# Patient Record
Sex: Male | Born: 1970 | Race: Black or African American | Hispanic: No | Marital: Married | Smoking: Never smoker
Health system: Southern US, Community
[De-identification: ages and names within clinical notes are randomized; demographics above are authoritative.]

## PROBLEM LIST (undated history)

## (undated) DIAGNOSIS — E7800 Pure hypercholesterolemia, unspecified: Secondary | ICD-10-CM

## (undated) DIAGNOSIS — K219 Gastro-esophageal reflux disease without esophagitis: Secondary | ICD-10-CM

## (undated) DIAGNOSIS — Z8619 Personal history of other infectious and parasitic diseases: Secondary | ICD-10-CM

## (undated) HISTORY — DX: Gastro-esophageal reflux disease without esophagitis: K21.9

## (undated) HISTORY — DX: Personal history of other infectious and parasitic diseases: Z86.19

## (undated) HISTORY — PX: ACHILLES TENDON SURGERY: SHX542

## (undated) HISTORY — PX: POLYPECTOMY: SHX149

## (undated) HISTORY — PX: ANTERIOR CRUCIATE LIGAMENT REPAIR: SHX115

## (undated) HISTORY — DX: Pure hypercholesterolemia, unspecified: E78.00

## (undated) HISTORY — PX: ANKLE SURGERY: SHX546

---

## 2008-10-18 ENCOUNTER — Emergency Department: Payer: Self-pay | Admitting: Emergency Medicine
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
## 2013-07-23 ENCOUNTER — Encounter (INDEPENDENT_AMBULATORY_CARE_PROVIDER_SITE_OTHER): Payer: Self-pay

## 2013-07-23 ENCOUNTER — Encounter: Payer: Self-pay | Admitting: Internal Medicine

## 2013-07-23 ENCOUNTER — Ambulatory Visit (INDEPENDENT_AMBULATORY_CARE_PROVIDER_SITE_OTHER): Payer: 59 | Admitting: Internal Medicine

## 2013-07-23 VITALS — BP 110/80 | HR 66 | Temp 98.5°F | Ht 71.75 in | Wt 226.2 lb

## 2013-07-23 DIAGNOSIS — E7800 Pure hypercholesterolemia, unspecified: Secondary | ICD-10-CM

## 2013-07-23 DIAGNOSIS — Z13220 Encounter for screening for lipoid disorders: Secondary | ICD-10-CM

## 2013-07-24 ENCOUNTER — Encounter: Payer: Self-pay | Admitting: Internal Medicine

## 2013-07-24 DIAGNOSIS — E7800 Pure hypercholesterolemia, unspecified: Secondary | ICD-10-CM | POA: Insufficient documentation

## 2013-07-24 NOTE — Assessment & Plan Note (Signed)
Low cholesterol diet and exercise.  Follow lipid panel.  Will recheck in the next couple of months.

## 2013-07-24 NOTE — Progress Notes (Signed)
  Subjective:    Patient ID: Dean Callahan, male    DOB: July 01, 1971, 42 y.o.   MRN: [id number]  HPI 42 year old male with past history of hypercholesterolemia who comes in today to follow up on this as well as to transfer his care here to [facility].  Former pt of mine at [employer].  States he has been doing well.  Stays active.  Has some issues with his knees.  Played sports when he was younger.  Had knee injuries and increased pounding and stress on his knees.  He is able to exercise, but is unable to run.  He can do the elliptical machine with no problems.  No chest pain or tightness with increased activity or exertion.  No sob.  Eating and drinking well.  No nausea or vomiting.  No urinary or bowel issues.  Work going well.  Recent labs with increased cholesterol.  Discussed low cholesterol diet and exercise.     Past Medical History  Diagnosis Date  . History of chicken pox   . Hypercholesterolemia     Review of Systems Patient denies any headache, lightheadedness or dizziness.  No sinus or allergy symptoms.   No chest pain, tightness or palpitations.  No increased shortness of breath, cough or congestion.  No nausea or vomiting.  No acid reflux.  No abdominal pain or cramping.  No bowel change, such as diarrhea, constipation, BRBPR or melana.  No urine change.  Overall feels he is doing well.  Some knee issues as outlined.        Objective:   Physical Exam Filed Vitals:   07/23/13 1034  BP: 110/80  Pulse: 66  Temp: 98.5 F (36.9 C)   Blood pressure recheck:  114/76, pulse 46  42 year old male in no acute distress.  HEENT:  Nares - clear.  Oropharynx - without lesions. NECK:  Supple.  Nontender.  No audible carotid bruit.  HEART:  Appears to be regular.   LUNGS:  No crackles or wheezing audible.  Respirations even and unlabored.   RADIAL PULSE:  Equal bilaterally.  ABDOMEN:  Soft.  Nontender.  Bowel sounds present and normal.  No audible abdominal bruit.   EXTREMITIES:  No increased  edema present.  DP pulses palpable and equal bilaterally.         Assessment & Plan:  HEALTH MAINTENANCE.  Schedule him for a physical next visit.  We discussed screening colonoscopy when due.  Obtain outside records.    I spent 25 minutes with the patient and more than 50% of the time was spent in consultation regarding the above.

## 2013-08-19 ENCOUNTER — Telehealth: Payer: Self-pay | Admitting: *Deleted

## 2013-08-19 NOTE — Telephone Encounter (Signed)
LMTCB: Notify pt that labs drawn at [facility] showed a slight increase in his total & LDL cholesterol. Continue low carb diet & exercise. Other labs are okay. Slight increase CR-but ok. Stay hydrated. Would like to recheck lab in the next few weeks. We will mail a lab order after he is aware of his lab results.

## 2013-09-11 ENCOUNTER — Encounter: Payer: Self-pay | Admitting: *Deleted

## 2013-10-29 ENCOUNTER — Encounter: Payer: Self-pay | Admitting: Internal Medicine

## 2013-11-10 ENCOUNTER — Ambulatory Visit (INDEPENDENT_AMBULATORY_CARE_PROVIDER_SITE_OTHER): Payer: 59 | Admitting: Internal Medicine

## 2013-11-10 ENCOUNTER — Encounter: Payer: Self-pay | Admitting: Internal Medicine

## 2013-11-10 VITALS — BP 130/80 | HR 71 | Temp 98.8°F | Ht 71.5 in | Wt 233.2 lb

## 2013-11-10 DIAGNOSIS — M25569 Pain in unspecified knee: Secondary | ICD-10-CM

## 2013-11-10 DIAGNOSIS — E7800 Pure hypercholesterolemia, unspecified: Secondary | ICD-10-CM

## 2013-11-10 NOTE — Progress Notes (Signed)
Pre-visit discussion using our clinic review tool. No additional management support is needed unless otherwise documented below in the visit note.  

## 2013-11-10 NOTE — Progress Notes (Signed)
  Subjective:    Patient ID: Dean Callahan, male    DOB: 11/25/1970, 43 y.o.   MRN: [id number]  HPI 43 year old male with past history of hypercholesterolemia who comes in today to follow up this as well as for a complete physical exam.   States he has been doing well.  Stays active.  Has some issues with his knees.  Played sports when he was younger.  Had knee injuries and increased pounding and stress on his knees.  He is able to exercise, but is unable to run.  He can do the elliptical machine with no problems.  No chest pain or tightness with increased activity or exertion.  No sob.  Eating and drinking well.  No nausea or vomiting.  No urinary or bowel issues.  Work going well.  Recent labs with increased cholesterol.  Discussed low cholesterol diet and exercise.     Past Medical History  Diagnosis Date  . History of chicken pox   . Hypercholesterolemia     Review of Systems Patient denies any headache, lightheadedness or dizziness.  No sinus or allergy symptoms.   No chest pain, tightness or palpitations.  No increased shortness of breath, cough or congestion.  No nausea or vomiting.  No acid reflux.  No abdominal pain or cramping.  No bowel change, such as diarrhea, constipation, BRBPR or melana.  No urine change.  Overall feels he is doing well.  Some knee issues as outlined.        Objective:   Physical Exam  Filed Vitals:   11/10/13 1037  BP: 130/80  Pulse: 71  Temp: 98.8 F (37.1 C)   Blood pressure recheck:  30/21  43 year old male in no acute distress.  HEENT:  Nares - clear.  Oropharynx - without lesions. NECK:  Supple.  Nontender.  No audible carotid bruit.  HEART:  Appears to be regular.   LUNGS:  No crackles or wheezing audible.  Respirations even and unlabored.   RADIAL PULSE:  Equal bilaterally.  ABDOMEN:  Soft.  Nontender.  Bowel sounds present and normal.  No audible abdominal bruit.  GU:  Normal descended testicles.  No palpable testicular nodules.   RECTAL:   Could not appreciate any palpable prostate nodules.  Heme negative.   EXTREMITIES:  No increased edema present.  DP pulses palpable and equal bilaterally.         Assessment & Plan:  HEALTH MAINTENANCE. Physical today.   We discussed screening colonoscopy when due.     I spent 25 minutes with the patient and more than 50% of the time was spent in consultation regarding the above.

## 2013-11-11 ENCOUNTER — Encounter: Payer: Self-pay | Admitting: Internal Medicine

## 2013-11-11 DIAGNOSIS — M25569 Pain in unspecified knee: Secondary | ICD-10-CM | POA: Insufficient documentation

## 2013-11-11 NOTE — Assessment & Plan Note (Signed)
Able to exercise on the eliptical machine.  Desires no further intervention.  Follow.

## 2013-11-11 NOTE — Assessment & Plan Note (Signed)
Low cholesterol diet and continue exercise.  Follow cholesterol.

## 2013-11-18 ENCOUNTER — Encounter: Payer: Self-pay | Admitting: Internal Medicine

## 2014-01-20 ENCOUNTER — Encounter: Payer: Self-pay | Admitting: Internal Medicine

## 2014-05-14 LAB — BASIC METABOLIC PANEL
Creatinine: 1.4 mg/dL — AB (ref 0.6–1.3)
GLUCOSE: 97 mg/dL

## 2014-05-14 LAB — LIPID PANEL
Cholesterol: 209 mg/dL — AB (ref 0–200)
HDL: 54 mg/dL (ref 35–70)
LDL Cholesterol: 13 mg/dL
Triglycerides: 63 mg/dL (ref 40–160)

## 2014-05-14 LAB — HEMOGLOBIN A1C: HEMOGLOBIN A1C: 5.3 % (ref 4.0–6.0)

## 2014-07-23 ENCOUNTER — Encounter: Payer: Self-pay | Admitting: Internal Medicine

## 2014-07-23 ENCOUNTER — Ambulatory Visit (INDEPENDENT_AMBULATORY_CARE_PROVIDER_SITE_OTHER): Payer: 59 | Admitting: Internal Medicine

## 2014-07-23 VITALS — BP 110/70 | HR 74 | Temp 98.6°F | Ht 71.5 in | Wt 226.2 lb

## 2014-07-23 DIAGNOSIS — Z0289 Encounter for other administrative examinations: Secondary | ICD-10-CM

## 2014-07-23 NOTE — Progress Notes (Signed)
Pre visit review using our clinic review tool, if applicable. No additional management support is needed unless otherwise documented below in the visit note. 

## 2014-08-01 ENCOUNTER — Encounter: Payer: Self-pay | Admitting: Internal Medicine

## 2014-08-01 NOTE — Progress Notes (Signed)
  Subjective:    Patient ID: Dean Callahan, male    DOB: Jan 07, 1971, 43 y.o.   MRN: [id number]  HPI 43 year old male with past history of hypercholesterolemia who comes in today as a work in for form completion.  States he has been doing well.  Stays active.  Exercises regularly.  BMI was measures at 31.  We discussed watching diet.  Continue exercise.  Has increased muscle mass.  Overall he feels he is doing well.      Past Medical History  Diagnosis Date  . History of chicken pox   . Hypercholesterolemia     Review of Systems Patient denies any headache, lightheadedness or dizziness.  No sinus or allergy symptoms.   No chest pain, tightness or palpitations.  No increased shortness of breath, cough or congestion.  No nausea or vomiting.  No acid reflux.  No abdominal pain or cramping.  No bowel change.  Still with knee issues.  Desires no further intervention.  Overall feels he is doing well.         Objective:   Physical Exam  Filed Vitals:   07/23/14 1325  BP: 110/70  Pulse: 74  Temp: 98.6 F (31 C)   43 year old male in no acute distress.  HEENT:  Nares - clear.  Oropharynx - without lesions. NECK:  Supple.  Nontender.  No audible carotid bruit.  HEART:  Appears to be regular.   LUNGS:  No crackles or wheezing audible.  Respirations even and unlabored.   RADIAL PULSE:  Equal bilaterally.  ABDOMEN:  Soft.  Nontender.  Bowel sounds present and normal.  No audible abdominal bruit.   EXTREMITIES:  No increased edema present.  DP pulses palpable and equal bilaterally.         Assessment & Plan:  HEALTH MAINTENANCE. Physical last visit.    We discussed screening colonoscopy when due.     FORM COMPLETION.  Form completed.  He exercises regularly.  Discussed diet adjustment.  Has increased muscle mass.  See form for appeal.

## 2014-09-25 HISTORY — PX: COLONOSCOPY: SHX174

## 2014-10-13 ENCOUNTER — Telehealth: Payer: Self-pay | Admitting: Internal Medicine

## 2014-10-13 NOTE — Telephone Encounter (Signed)
left msg to call office to reschedule appt  1/19.msn

## 2014-10-20 ENCOUNTER — Encounter: Payer: Self-pay | Admitting: Internal Medicine

## 2014-11-16 ENCOUNTER — Encounter: Payer: 59 | Admitting: Internal Medicine

## 2014-11-30 ENCOUNTER — Ambulatory Visit (INDEPENDENT_AMBULATORY_CARE_PROVIDER_SITE_OTHER): Payer: 59 | Admitting: Internal Medicine

## 2014-11-30 ENCOUNTER — Encounter: Payer: Self-pay | Admitting: Internal Medicine

## 2014-11-30 VITALS — BP 107/73 | HR 66 | Temp 97.7°F | Ht 71.0 in | Wt 229.4 lb

## 2014-11-30 DIAGNOSIS — M25569 Pain in unspecified knee: Secondary | ICD-10-CM

## 2014-11-30 DIAGNOSIS — M25521 Pain in right elbow: Secondary | ICD-10-CM

## 2014-11-30 DIAGNOSIS — Z1211 Encounter for screening for malignant neoplasm of colon: Secondary | ICD-10-CM

## 2014-11-30 DIAGNOSIS — E7800 Pure hypercholesterolemia, unspecified: Secondary | ICD-10-CM

## 2014-11-30 DIAGNOSIS — Z0000 Encounter for general adult medical examination without abnormal findings: Secondary | ICD-10-CM

## 2014-11-30 NOTE — Progress Notes (Signed)
Pre visit review using our clinic review tool, if applicable. No additional management support is needed unless otherwise documented below in the visit note. 

## 2014-11-30 NOTE — Progress Notes (Signed)
Patient ID: Dean Callahan, male   DOB: [birth year], 44 y.o.   MRN: [id number]   Subjective:    Patient ID: Dean Callahan, male    DOB: 1971-01-17, 44 y.o.   MRN: [id number]  HPI  Patient here for his physical exam.  States his knees are stable.  Still goes to the gym and works out. States overall he is dong relatively well.  He reports his knees are stable.  Able to exercise.  Desires no further intervention.  No cardiac symptoms with increased activity or exertion.  Breathing stable.  Bowels stable.  No blood.  He does report some right elbow pain.  Unable to do push ups.  He does lift weights.  Denies any known trauma.  He has tried icing and ibuprofen.  Persistent now for 3-4 months.     Past Medical History  Diagnosis Date  . History of chicken pox   . Hypercholesterolemia     Review of Systems  Constitutional: Negative for appetite change and unexpected weight change.  HENT: Negative for congestion and sinus pressure.   Eyes: Negative for pain and visual disturbance.  Respiratory: Negative for cough, chest tightness and shortness of breath.   Cardiovascular: Negative for chest pain, palpitations and leg swelling.  Gastrointestinal: Negative for nausea, vomiting, abdominal pain and diarrhea.  Genitourinary: Negative for frequency and difficulty urinating.  Musculoskeletal:       Reports knee pain is stable.  Increased right elbow pain as outlined.    Skin: Negative for color change and rash.  Neurological: Negative for dizziness, light-headedness and headaches.  Hematological: Negative for adenopathy. Does not bruise/bleed easily.  Psychiatric/Behavioral: Negative for dysphoric mood and agitation.       Objective:     Blood pressure recheck:  112/78, pulse 56  Physical Exam  Constitutional: He is oriented to person, place, and time. He appears well-developed and well-nourished. No distress.  HENT:  Head: Normocephalic.  Nose: Nose normal.  Mouth/Throat: Oropharynx is  clear and moist. No oropharyngeal exudate.  Eyes: Conjunctivae are normal. Right eye exhibits no discharge. Left eye exhibits no discharge.  Neck: Neck supple. No thyromegaly present.  Cardiovascular: Normal rate and regular rhythm.   Pulmonary/Chest: Breath sounds normal. No respiratory distress. He has no wheezes.  Abdominal: Soft. Bowel sounds are normal. There is no tenderness.  Genitourinary:  Reveals normal descended testicles.  Rectal exam reveals no palpable prostate nodules.  Heme negative.    Musculoskeletal: He exhibits no edema or tenderness.  Lymphadenopathy:    He has no cervical adenopathy.  Neurological: He is alert and oriented to person, place, and time.  Skin: Skin is warm and dry. No rash noted.  Psychiatric: He has a normal mood and affect. His behavior is normal.    BP 107/73 mmHg  Pulse 66  Temp(Src) 97.7 F (36.5 C) (Oral)  Ht 5\' 11"  (1.803 m)  Wt 229 lb 6 oz (104.044 kg)  BMI 32.01 kg/m2  SpO2 99% Wt Readings from Last 3 Encounters:  11/30/14 229 lb 6 oz (104.044 kg)  07/23/14 226 lb 4 oz (102.626 kg)  11/10/13 233 lb 4 oz (105.802 kg)     Lab Results  Component Value Date   CHOL 209* 05/14/2014   TRIG 63 05/14/2014   HDL 54 05/14/2014   LDLCALC 13 05/14/2014   CREATININE 1.4* 05/14/2014   HGBA1C 5.3 05/14/2014       Assessment & Plan:   Problem List Items Addressed This Visit  Health care maintenance    Physical today as outlined.  Check psa.  He is 34 (african [place]).  Will refer to GI for colon screening.        Hypercholesterolemia    Low cholesterol diet and exercise.  Follow lipid panel.        Knee pain    Previously played football.  Overall feels is stable.  Able to exercise.  Desires no further evaluation at this point.        Right elbow pain - Primary    Persistent pain.  Present now for 3-4 months.  Has been icing and taking ibuprofen.  Unable to do a push up.  Is affected by lifting weights. Given  persistent/increasing pain despite conservative measures, will refer to ortho for further evaluation and treatment.        Relevant Orders   Ambulatory referral to Orthopedic Surgery    Other Visit Diagnoses    Colon cancer screening        Relevant Orders    Ambulatory referral to Gastroenterology        Dean Pheasant, MD

## 2014-12-06 ENCOUNTER — Encounter: Payer: Self-pay | Admitting: Internal Medicine

## 2014-12-06 DIAGNOSIS — Z0000 Encounter for general adult medical examination without abnormal findings: Secondary | ICD-10-CM | POA: Insufficient documentation

## 2014-12-06 NOTE — Assessment & Plan Note (Signed)
Low cholesterol diet and exercise.  Follow lipid panel.   

## 2014-12-06 NOTE — Assessment & Plan Note (Signed)
Previously played football.  Overall feels is stable.  Able to exercise.  Desires no further evaluation at this point.

## 2014-12-06 NOTE — Assessment & Plan Note (Signed)
Physical today as outlined.  Check psa.  He is 34 (african [place]).  Will refer to GI for colon screening.

## 2014-12-06 NOTE — Assessment & Plan Note (Signed)
Persistent pain.  Present now for 3-4 months.  Has been icing and taking ibuprofen.  Unable to do a push up.  Is affected by lifting weights. Given persistent/increasing pain despite conservative measures, will refer to ortho for further evaluation and treatment.

## 2014-12-09 ENCOUNTER — Encounter: Payer: Self-pay | Admitting: Internal Medicine

## 2015-01-25 ENCOUNTER — Ambulatory Visit (INDEPENDENT_AMBULATORY_CARE_PROVIDER_SITE_OTHER): Payer: 59 | Admitting: Internal Medicine

## 2015-01-25 ENCOUNTER — Encounter: Payer: Self-pay | Admitting: Internal Medicine

## 2015-01-25 VITALS — BP 112/80 | HR 60 | Ht 71.0 in | Wt 228.0 lb

## 2015-01-25 DIAGNOSIS — Z1211 Encounter for screening for malignant neoplasm of colon: Secondary | ICD-10-CM

## 2015-01-25 MED ORDER — NA SULFATE-K SULFATE-MG SULF 17.5-3.13-1.6 GM/177ML PO SOLN: 1.0000 | Freq: Once | ORAL | Status: DC

## 2015-01-25 NOTE — Progress Notes (Signed)
HISTORY OF PRESENT ILLNESS:  Dean Callahan is a 45 y.o. male with no significant past medical history who is referred by Dr. Nicki Reaper regarding screening colonoscopy in a 44 year old African-American male. Patient's GI review of systems is entirely negative. No family history of colon cancer.  REVIEW OF SYSTEMS:  All non-GI ROS negative except for occasional joint aches  Past Medical History  Diagnosis Date  . History of chicken pox   . Hypercholesterolemia     Past Surgical History  Procedure Laterality Date  . Anterior cruciate ligament repair Right   . Ankle surgery Left   . Achilles tendon surgery Right     Social History Dean Callahan  reports that he has never smoked. He has never used smokeless tobacco. He reports that he does not drink alcohol or use illicit drugs.  family history includes Diabetes in his mother; Heart disease in his paternal grandmother; Hyperlipidemia in his father.  No Known Allergies     PHYSICAL EXAMINATION: Vital signs: BP 112/80 mmHg  Pulse 60  Ht 5\' 11"  (1.803 m)  Wt 228 lb (103.42 kg)  BMI 31.81 kg/m2  Constitutional: generally well-appearing, no acute distress Psychiatric: alert and oriented x3, cooperative Eyes: extraocular movements intact, anicteric, conjunctiva pink Mouth: oral pharynx moist, no lesions Neck: supple no lymphadenopathy Cardiovascular: heart regular rate and rhythm, no murmur Lungs: clear to auscultation bilaterally Abdomen: soft, nontender, nondistended, no obvious ascites, no peritoneal signs, normal bowel sounds, no organomegaly Rectal: Deferred until colonoscopy Extremities: no lower extremity edema bilaterally Skin: no lesions on visible extremities Neuro: No focal deficits.   ASSESSMENT:  #1. Colon cancer screening. 44 year old African-American. Appropriate candidate in this demographic without contraindication  PLAN:  #1. Screening colonoscopy.The nature of the procedure, as well as the risks, benefits,  and alternatives were carefully and thoroughly reviewed with the patient. Ample time for discussion and questions allowed. The patient understood, was satisfied, and agreed to proceed.  A copy of this consultation has been sent to Dr. Nicki Reaper

## 2015-01-25 NOTE — Patient Instructions (Signed)
You have been scheduled for a colonoscopy. Please follow written instructions given to you at your visit today.  Please pick up your prep supplies at the pharmacy within the next 1-3 days. If you use inhalers (even only as needed), please bring them with you on the day of your procedure.   

## 2015-01-28 ENCOUNTER — Encounter: Payer: Self-pay | Admitting: Internal Medicine

## 2015-02-26 ENCOUNTER — Encounter: Payer: Self-pay | Admitting: Internal Medicine

## 2015-02-26 ENCOUNTER — Ambulatory Visit (AMBULATORY_SURGERY_CENTER): Payer: 59 | Admitting: Internal Medicine

## 2015-02-26 VITALS — BP 123/66 | HR 65 | Temp 96.9°F | Resp 16 | Ht 71.0 in | Wt 228.0 lb

## 2015-02-26 DIAGNOSIS — D122 Benign neoplasm of ascending colon: Secondary | ICD-10-CM

## 2015-02-26 DIAGNOSIS — Z1211 Encounter for screening for malignant neoplasm of colon: Secondary | ICD-10-CM | POA: Diagnosis present

## 2015-02-26 MED ORDER — SODIUM CHLORIDE 0.9 % IV SOLN: 500.0000 mL | INTRAVENOUS | Status: DC

## 2015-02-26 NOTE — Progress Notes (Signed)
Called to room to assist during endoscopic procedure.  Patient ID and intended procedure confirmed with present staff. Received instructions for my participation in the procedure from the performing physician.  

## 2015-02-26 NOTE — Progress Notes (Signed)
Report to PACU, RN, vss, BBS= Clear.  

## 2015-02-26 NOTE — Patient Instructions (Signed)
YOU HAD AN ENDOSCOPIC PROCEDURE TODAY AT [facility]:   Refer to the procedure report that was given to you for any specific questions about what was found during the examination.  If the procedure report does not answer your questions, please call your gastroenterologist to clarify.  If you requested that your care partner not be given the details of your procedure findings, then the procedure report has been included in a sealed envelope for you to review at your convenience later.  YOU SHOULD EXPECT: Some feelings of bloating in the abdomen. Passage of more gas than usual.  Walking can help get rid of the air that was put into your GI tract during the procedure and reduce the bloating. If you had a lower endoscopy (such as a colonoscopy or flexible sigmoidoscopy) you may notice spotting of blood in your stool or on the toilet paper. If you underwent a bowel prep for your procedure, you may not have a normal bowel movement for a few days.  Please Note:  You might notice some irritation and congestion in your nose or some drainage.  This is from the oxygen used during your procedure.  There is no need for concern and it should clear up in a day or so.  SYMPTOMS TO REPORT IMMEDIATELY:   Following lower endoscopy (colonoscopy or flexible sigmoidoscopy):  Excessive amounts of blood in the stool  Significant tenderness or worsening of abdominal pains  Swelling of the abdomen that is new, acute  Fever of 100F or higher  For urgent or emergent issues, a gastroenterologist can be reached at any hour by calling [phone].   DIET: Your first meal following the procedure should be a small meal and then it is ok to progress to your normal diet. Heavy or fried foods are harder to digest and may make you feel nauseous or bloated.  Likewise, meals heavy in dairy and vegetables can increase bloating.  Drink plenty of fluids but you should avoid alcoholic beverages for 24  hours.  ACTIVITY:  You should plan to take it easy for the rest of today and you should NOT DRIVE or use heavy machinery until tomorrow (because of the sedation medicines used during the test).    FOLLOW UP: Our staff will call the number listed on your records the next business day following your procedure to check on you and address any questions or concerns that you may have regarding the information given to you following your procedure. If we do not reach you, we will leave a message.  However, if you are feeling well and you are not experiencing any problems, there is no need to return our call.  We will assume that you have returned to your regular daily activities without incident.  If any biopsies were taken you will be contacted by phone or by letter within the next 1-3 weeks.  Please call us at [phone] if you have not heard about the biopsies in 3 weeks.    SIGNATURES/CONFIDENTIALITY: You and/or your care partner have signed paperwork which will be entered into your electronic medical record.  These signatures attest to the fact that that the information above on your After Visit Summary has been reviewed and is understood.  Full responsibility of the confidentiality of this discharge information lies with you and/or your care-partner.  Polyp, diverticulosis, and high fiber diet information given.

## 2015-02-26 NOTE — Op Note (Signed)
[facility]  [employer] [place], [id number]   COLONOSCOPY PROCEDURE REPORT  PATIENT: Dean Callahan, Dean Callahan  MR#: [id number] BIRTHDATE: 11/23/1970 , 44  yrs. old GENDER: male ENDOSCOPIST: Eustace Quail, MD REFERRED GO:[id number] Nicki Reaper, M.D. PROCEDURE DATE:  02/26/2015 PROCEDURE:   Colonoscopy, screening and Colonoscopy with snare polypectomy X2 First Screening Colonoscopy - Avg.  risk and is 50 yrs.  old or older Yes.  Prior Negative Screening - Now for repeat screening. N/A  History of Adenoma - Now for follow-up colonoscopy & has been > or = to 3 yrs.  N/A  Polyps removed today? Yes ASA CLASS:   Class I INDICATIONS:Screening for colonic neoplasia and Colorectal Neoplasm Risk Assessment for this procedure is average risk. MEDICATIONS: Propofol 300 mg IV and Monitored anesthesia care  DESCRIPTION OF PROCEDURE:   After the risks benefits and alternatives of the procedure were thoroughly explained, informed consent was obtained.  The digital rectal exam revealed no abnormalities of the rectum.   The LB [id number]  endoscope was introduced through the anus and advanced to the cecum, which was identified by both the appendix and ileocecal valve. No adverse events experienced.   The quality of the prep was excellent. (Suprep was used)  The instrument was then slowly withdrawn as the colon was fully examined. Estimated blood loss is zero unless otherwise noted in this procedure report.      COLON FINDINGS: Two polyps ranging between 3-8mm in size were found in the ascending colon.  A polypectomy was performed with a cold snare.  The resection was complete, the polyp tissue was completely retrieved and sent to histology.   There was mild diverticulosis noted in the sigmoid colon.   The examination was otherwise normal. Retroflexed views revealed no abnormalities. The time to cecum = 3.5 Withdrawal time = 11.5   The scope was withdrawn and the procedure  completed. COMPLICATIONS: There were no immediate complications.  ENDOSCOPIC IMPRESSION: 1.   Two polyps were found in the ascending colon; polypectomy was performed with a cold snare 2.   Mild diverticulosis was noted in the sigmoid colon 3.   The examination was otherwise normal  RECOMMENDATIONS: 1. Repeat colonoscopy in 5 years if polyp adenomatous; otherwise 10 years  eSigned:  Eustace Quail, MD 02/26/2015 2:49 PM   cc: The Patient and Einar Pheasant, MD

## 2015-03-01 ENCOUNTER — Telehealth: Payer: Self-pay | Admitting: *Deleted

## 2015-03-01 NOTE — Telephone Encounter (Signed)
  Follow up Call-  Call back number 02/26/2015  Post procedure Call Back phone  # [phone]  Permission to leave phone message Yes     Patient questions:  Do you have a fever, pain , or abdominal swelling? No. Pain Score  0 *  Have you tolerated food without any problems? Yes.    Have you been able to return to your normal activities? Yes.    Do you have any questions about your discharge instructions: Diet   No. Medications  No. Follow up visit  No.  Do you have questions or concerns about your Care? No.  Actions: * If pain score is 4 or above: No action needed, pain <4.

## 2015-03-02 ENCOUNTER — Encounter: Payer: Self-pay | Admitting: Internal Medicine

## 2015-03-04 ENCOUNTER — Encounter: Payer: Self-pay | Admitting: Internal Medicine

## 2015-03-04 DIAGNOSIS — Z86010 Personal history of colonic polyps: Secondary | ICD-10-CM | POA: Insufficient documentation

## 2015-05-20 LAB — LIPID PANEL
Cholesterol: 210 mg/dL — AB (ref 0–200)
HDL: 48 mg/dL (ref 35–70)
LDL Cholesterol: 149 mg/dL
LDL/HDL RATIO: 4.4
TRIGLYCERIDES: 65 mg/dL (ref 40–160)

## 2015-05-20 LAB — HEMOGLOBIN A1C: Hgb A1c MFr Bld: 5 % (ref 4.0–6.0)

## 2015-05-20 LAB — BASIC METABOLIC PANEL
Creatinine: 1.4 mg/dL — AB (ref 0.6–1.3)
Glucose: 96 mg/dL

## 2015-07-09 ENCOUNTER — Telehealth: Payer: Self-pay | Admitting: *Deleted

## 2015-07-09 NOTE — Telephone Encounter (Signed)
Left message for pt to call the office to schedule an appt to discuss weight loss. Was offered an appt on 07/13/15 @ 9:30am. Just needs to schedule the actual appt.

## 2015-07-12 NOTE — Telephone Encounter (Signed)
Left message for patient to call & let us know if he can make appt tomorrow (actual appt has not been booked)

## 2015-07-13 ENCOUNTER — Telehealth: Payer: Self-pay | Admitting: *Deleted

## 2015-07-13 NOTE — Telephone Encounter (Signed)
Patient is out of town, couldn't come to the appointment slotted for this am.  Would like to reschedule.  Please advise where on your schedule? thanks

## 2015-07-13 NOTE — Telephone Encounter (Signed)
I can see him on 07/22/15 at 12:00.

## 2015-07-13 NOTE — Telephone Encounter (Signed)
Patient requested a all back pertaining to his  paper work that need to be filled out. Patient had a question before scheduling an appt.

## 2015-07-13 NOTE — Telephone Encounter (Signed)
Please see if he can come in at 12:00 on 07/16/15.  If this is a problem, let me know and I will find another spot.

## 2015-07-13 NOTE — Telephone Encounter (Signed)
Left a message to return my call.

## 2015-07-13 NOTE — Telephone Encounter (Signed)
Not available to come Friday.  Stated on his message that he can come any day next week.

## 2015-07-14 NOTE — Telephone Encounter (Signed)
Patient coming in on 07/22/2015 @12 :00

## 2015-07-14 NOTE — Telephone Encounter (Signed)
Scheduled

## 2015-07-22 ENCOUNTER — Ambulatory Visit (INDEPENDENT_AMBULATORY_CARE_PROVIDER_SITE_OTHER): Payer: 59 | Admitting: Internal Medicine

## 2015-07-22 ENCOUNTER — Encounter: Payer: Self-pay | Admitting: Internal Medicine

## 2015-07-22 VITALS — BP 120/80 | HR 78 | Temp 98.4°F | Resp 18 | Ht 71.0 in | Wt 209.0 lb

## 2015-07-22 DIAGNOSIS — R748 Abnormal levels of other serum enzymes: Secondary | ICD-10-CM

## 2015-07-22 DIAGNOSIS — E7800 Pure hypercholesterolemia, unspecified: Secondary | ICD-10-CM | POA: Diagnosis not present

## 2015-07-22 DIAGNOSIS — E663 Overweight: Secondary | ICD-10-CM

## 2015-07-22 DIAGNOSIS — R7989 Other specified abnormal findings of blood chemistry: Secondary | ICD-10-CM

## 2015-07-22 DIAGNOSIS — M25569 Pain in unspecified knee: Secondary | ICD-10-CM | POA: Diagnosis not present

## 2015-07-22 NOTE — Progress Notes (Signed)
Pre-visit discussion using our clinic review tool. No additional management support is needed unless otherwise documented below in the visit note.  

## 2015-07-22 NOTE — Progress Notes (Signed)
Patient ID: Dean Callahan, male   DOB: 03-Jan-1971, 44 y.o.   MRN: [id number]   Subjective:    Patient ID: Dean Callahan, male    DOB: [birth year], 44 y.o.   MRN: [id number]  HPI  Patient with past history of hypercholesterolemia.  He comes in today as a work to have a work form completed.  When they did the work assessment, his BMI was 31.  He has since adjusted his diet.  Has lost weight.  Feels better.  Is exercising.  No chest pain or tightness.   No sob.  No acid reflux.  No abdominal pain or cramping.  Bowels stable.  Discussed his recent labs.  Cholesterol elevated.  Discussed continued diet and exercise.  Discussed the need to stay hydrated.    Past Medical History  Diagnosis Date  . History of chicken pox   . Hypercholesterolemia    Past Surgical History  Procedure Laterality Date  . Anterior cruciate ligament repair Right   . Ankle surgery Left   . Achilles tendon surgery Right    Family History  Problem Relation Age of Onset  . Hyperlipidemia Father   . Heart disease Paternal Grandmother     heart attack  . Diabetes Mother    Social History   Social History  . Marital Status: Married    Spouse Name: N/A  . Number of Children: 3  . Years of Education: N/A   Occupational History  . manager    Social History Main Topics  . Smoking status: Never Smoker   . Smokeless tobacco: Never Used  . Alcohol Use: No  . Drug Use: No  . Sexual Activity: Not Asked   Other Topics Concern  . None   Social History Narrative    No outpatient encounter prescriptions on file as of 07/22/2015.   No facility-administered encounter medications on file as of 07/22/2015.    Review of Systems  Constitutional: Negative for fatigue.       Has adjusted his diet.   Has lost weight.    HENT: Negative for congestion and sinus pressure.   Eyes: Negative for discharge and visual disturbance.  Respiratory: Negative for cough, chest tightness and shortness of breath.   Cardiovascular:  Negative for chest pain, palpitations and leg swelling.  Gastrointestinal: Negative for nausea, vomiting, abdominal pain and diarrhea.  Genitourinary: Negative for dysuria and difficulty urinating.  Musculoskeletal: Negative for back pain and joint swelling.       Still has issues with his knees.  Is exercising.    Skin: Negative for color change and rash.  Neurological: Negative for dizziness and light-headedness.  Psychiatric/Behavioral: Negative for dysphoric mood and agitation.       Objective:    Physical Exam  Constitutional: He appears well-developed and well-nourished. No distress.  HENT:  Nose: Nose normal.  Mouth/Throat: Oropharynx is clear and moist.  Eyes: Conjunctivae are normal. Right eye exhibits no discharge. Left eye exhibits no discharge.  Neck: Neck supple. No thyromegaly present.  Cardiovascular: Normal rate and regular rhythm.   Pulmonary/Chest: Effort normal and breath sounds normal. No respiratory distress.  Abdominal: Soft. Bowel sounds are normal. There is no tenderness.  Musculoskeletal: He exhibits no edema or tenderness.  Lymphadenopathy:    He has no cervical adenopathy.  Skin: No rash noted. No erythema.  Psychiatric: He has a normal mood and affect. His behavior is normal.    BP 120/80 mmHg  Pulse 78  Temp(Src) 98.4 F (36.9 C) (  Oral)  Resp 18  Ht 5\' 11"  (1.803 m)  Wt 209 lb (94.802 kg)  BMI 29.16 kg/m2  SpO2 97% Wt Readings from Last 3 Encounters:  07/22/15 209 lb (94.802 kg)  02/26/15 228 lb (103.42 kg)  01/25/15 228 lb (103.42 kg)     Lab Results  Component Value Date   CHOL 210* 05/20/2015   TRIG 65 05/20/2015   HDL 48 05/20/2015   LDLCALC 149 05/20/2015   CREATININE 1.4* 05/20/2015   HGBA1C 5.0 05/20/2015       Assessment & Plan:   Problem List Items Addressed This Visit    Elevated serum creatinine    Stay hydrated.  Discussed increased muscle mass.  Follow metabolic panel.       Hypercholesterolemia - Primary     Low cholesterol diet and exercise.  Just had cholesterol checked.  Reviewed.  He has adjusted diet and lost weight since checked.  Recheck lipid panel in two months.        Knee pain    Persistent issues.  Is exercising.  Feels stable.  Desires no further intervention at this time.  Follow.        Overweight (BMI 25.0-29.9)    He has adjusted his diet and lost weight.  Continue.  Discussed diet and exercise.  Follow.  Form completed for work.           Einar Pheasant, MD

## 2015-07-27 ENCOUNTER — Encounter: Payer: Self-pay | Admitting: Internal Medicine

## 2015-07-27 DIAGNOSIS — E663 Overweight: Secondary | ICD-10-CM | POA: Insufficient documentation

## 2015-07-27 DIAGNOSIS — R7989 Other specified abnormal findings of blood chemistry: Secondary | ICD-10-CM | POA: Insufficient documentation

## 2015-07-27 NOTE — Assessment & Plan Note (Signed)
Low cholesterol diet and exercise.  Just had cholesterol checked.  Reviewed.  He has adjusted diet and lost weight since checked.  Recheck lipid panel in two months.

## 2015-07-27 NOTE — Assessment & Plan Note (Addendum)
He has adjusted his diet and lost weight.  Continue.  Discussed diet and exercise.  Follow.  Form completed for work.

## 2015-07-27 NOTE — Assessment & Plan Note (Signed)
Persistent issues.  Is exercising.  Feels stable.  Desires no further intervention at this time.  Follow.

## 2015-07-27 NOTE — Assessment & Plan Note (Signed)
Stay hydrated.  Discussed increased muscle mass.  Follow metabolic panel.

## 2015-12-02 ENCOUNTER — Ambulatory Visit (INDEPENDENT_AMBULATORY_CARE_PROVIDER_SITE_OTHER): Payer: 59 | Admitting: Internal Medicine

## 2015-12-02 ENCOUNTER — Encounter: Payer: Self-pay | Admitting: Internal Medicine

## 2015-12-02 VITALS — BP 100/78 | HR 71 | Temp 98.2°F | Resp 18 | Ht 71.0 in | Wt 223.4 lb

## 2015-12-02 DIAGNOSIS — E663 Overweight: Secondary | ICD-10-CM | POA: Diagnosis not present

## 2015-12-02 DIAGNOSIS — Z0000 Encounter for general adult medical examination without abnormal findings: Secondary | ICD-10-CM

## 2015-12-02 DIAGNOSIS — M25569 Pain in unspecified knee: Secondary | ICD-10-CM

## 2015-12-02 DIAGNOSIS — Z86010 Personal history of colonic polyps: Secondary | ICD-10-CM

## 2015-12-02 DIAGNOSIS — E7800 Pure hypercholesterolemia, unspecified: Secondary | ICD-10-CM

## 2015-12-02 DIAGNOSIS — K219 Gastro-esophageal reflux disease without esophagitis: Secondary | ICD-10-CM

## 2015-12-02 NOTE — Progress Notes (Signed)
Patient ID: Dean Callahan, male   DOB: 07-14-1971, 45 y.o.   MRN: [id number]   Subjective:    Patient ID: Dean Callahan, male    DOB: 1971-07-03, 45 y.o.   MRN: [id number]  HPI  Patient here for a physical exam.  Tries to stay active.  No cardiac symptoms with increased activity or exertion.  No sob.  Reports having some intermittent episodes of acid reflux.  Just occurs occasionally.  Nothing on a regular basis.  No dysphagia.  No abdominal pain or cramping.  Bowels stable.  Knee pain - stable.  Still stays active.  Dicussed diet and exercise.     Past Medical History  Diagnosis Date  . History of chicken pox   . Hypercholesterolemia    Past Surgical History  Procedure Laterality Date  . Anterior cruciate ligament repair Right   . Ankle surgery Left   . Achilles tendon surgery Right    Family History  Problem Relation Age of Onset  . Hyperlipidemia Father   . Heart disease Paternal Grandmother     heart attack  . Diabetes Mother    Social History   Social History  . Marital Status: Married    Spouse Name: N/A  . Number of Children: 3  . Years of Education: N/A   Occupational History  . manager    Social History Main Topics  . Smoking status: Never Smoker   . Smokeless tobacco: Never Used  . Alcohol Use: No  . Drug Use: No  . Sexual Activity: Not Asked   Other Topics Concern  . None   Social History Narrative    No outpatient encounter prescriptions on file as of 12/02/2015.   No facility-administered encounter medications on file as of 12/02/2015.    Review of Systems  Constitutional: Negative for appetite change and unexpected weight change.  HENT: Negative for congestion and sinus pressure.   Eyes: Negative for pain and visual disturbance.  Respiratory: Negative for cough, chest tightness and shortness of breath.   Cardiovascular: Negative for chest pain, palpitations and leg swelling.  Gastrointestinal: Negative for nausea, vomiting, abdominal pain and  diarrhea.  Genitourinary: Negative for dysuria and difficulty urinating.  Musculoskeletal: Negative for back pain and joint swelling.  Skin: Negative for color change and rash.  Neurological: Negative for dizziness, light-headedness and headaches.  Hematological: Negative for adenopathy. Does not bruise/bleed easily.  Psychiatric/Behavioral: Negative for dysphoric mood and agitation.       Objective:    Physical Exam  Constitutional: He is oriented to person, place, and time. He appears well-developed and well-nourished. No distress.  HENT:  Head: Normocephalic and atraumatic.  Nose: Nose normal.  Mouth/Throat: Oropharynx is clear and moist. No oropharyngeal exudate.  Eyes: Conjunctivae are normal. Right eye exhibits no discharge. Left eye exhibits no discharge.  Neck: Neck supple. No thyromegaly present.  Cardiovascular: Normal rate and regular rhythm.   Pulmonary/Chest: Breath sounds normal. No respiratory distress. He has no wheezes.  Abdominal: Soft. Bowel sounds are normal. There is no tenderness.  Genitourinary:  Rectal exam - no palpable prostate nodule.  Heme negative.    Musculoskeletal: He exhibits no edema or tenderness.  Lymphadenopathy:    He has no cervical adenopathy.  Neurological: He is alert and oriented to person, place, and time.  Skin: Skin is warm and dry. No rash noted. No erythema.  Psychiatric: He has a normal mood and affect. His behavior is normal.    BP 100/78 mmHg  Pulse  71  Temp(Src) 98.2 F (36.8 C) (Oral)  Resp 18  Ht 5\' 11"  (1.803 m)  Wt 223 lb 6 oz (101.322 kg)  BMI 31.17 kg/m2  SpO2 97% Wt Readings from Last 3 Encounters:  12/02/15 223 lb 6 oz (101.322 kg)  07/22/15 209 lb (94.802 kg)  02/26/15 228 lb (103.42 kg)     Lab Results  Component Value Date   CHOL 210* 05/20/2015   TRIG 65 05/20/2015   HDL 48 05/20/2015   LDLCALC 149 05/20/2015   CREATININE 1.4* 05/20/2015   HGBA1C 5.0 05/20/2015       Assessment & Plan:    Problem List Items Addressed This Visit    GERD (gastroesophageal reflux disease)    Intermittent acid reflux.   Start zantac.  Follow.  Get him back in soon to reassess.        Health care maintenance    Physical today 11/30/15.  Colonoscopy 02/2015.  Follow psa.        History of colonic polyps    Colonoscopy 03/04/15.        Hypercholesterolemia    Low cholesterol diet and exercise.  Follow lipid panel.        Knee pain    Stable.  Desires no further intervention.  Follow.        Overweight (BMI 25.0-29.9) - Primary    Discussed diet and exercise.  Follow.            Einar Pheasant, MD

## 2015-12-02 NOTE — Progress Notes (Signed)
Pre-visit discussion using our clinic review tool. No additional management support is needed unless otherwise documented below in the visit note.  

## 2015-12-02 NOTE — Patient Instructions (Signed)
Zantac (ranitidine) 150mg - take one tablet 30 minutes before breakfast.   

## 2015-12-06 ENCOUNTER — Encounter: Payer: Self-pay | Admitting: Internal Medicine

## 2015-12-06 DIAGNOSIS — K219 Gastro-esophageal reflux disease without esophagitis: Secondary | ICD-10-CM | POA: Insufficient documentation

## 2015-12-06 NOTE — Assessment & Plan Note (Signed)
Stable.  Desires no further intervention.  Follow.   

## 2015-12-06 NOTE — Assessment & Plan Note (Signed)
Discussed diet and exercise.  Follow.  

## 2015-12-06 NOTE — Assessment & Plan Note (Signed)
Physical today 11/30/15.  Colonoscopy 02/2015.  Follow psa.

## 2015-12-06 NOTE — Assessment & Plan Note (Signed)
Low cholesterol diet and exercise.  Follow lipid panel.   

## 2015-12-06 NOTE — Assessment & Plan Note (Signed)
Intermittent acid reflux.   Start zantac.  Follow.  Get him back in soon to reassess.

## 2015-12-06 NOTE — Assessment & Plan Note (Signed)
Colonoscopy 03/04/15.

## 2016-02-24 ENCOUNTER — Ambulatory Visit: Payer: 59 | Admitting: Internal Medicine

## 2016-02-24 DIAGNOSIS — Z0289 Encounter for other administrative examinations: Secondary | ICD-10-CM

## 2016-07-13 ENCOUNTER — Other Ambulatory Visit: Payer: Self-pay | Admitting: Internal Medicine

## 2016-07-14 LAB — LIPID PANEL W/O CHOL/HDL RATIO
Cholesterol, Total: 203 mg/dL — ABNORMAL HIGH (ref 100–199)
HDL: 53 mg/dL (ref >39)
LDL Calculated: 139 mg/dL — ABNORMAL HIGH (ref 0–99)
Triglycerides: 56 mg/dL (ref 0–149)
VLDL Cholesterol Cal: 11 mg/dL (ref 5–40)

## 2016-07-14 LAB — CBC WITH DIFFERENTIAL/PLATELET
Basophils Absolute: 0 10*3/uL (ref 0.0–0.2)
Basos: 1 %
EOS (ABSOLUTE): 0.1 10*3/uL (ref 0.0–0.4)
Eos: 4 %
HEMATOCRIT: 43.1 % (ref 37.5–51.0)
Hemoglobin: 14.8 g/dL (ref 12.6–17.7)
Immature Grans (Abs): 0 10*3/uL (ref 0.0–0.1)
Immature Granulocytes: 0 %
LYMPHS ABS: 2 10*3/uL (ref 0.7–3.1)
Lymphs: 57 %
MCH: 32.2 pg (ref 26.6–33.0)
MCHC: 34.3 g/dL (ref 31.5–35.7)
MCV: 94 fL (ref 79–97)
MONOS ABS: 0.4 10*3/uL (ref 0.1–0.9)
Monocytes: 11 %
NEUTROS ABS: 1 10*3/uL — AB (ref 1.4–7.0)
Neutrophils: 27 %
Platelets: 225 10*3/uL (ref 150–379)
RBC: 4.6 x10E6/uL (ref 4.14–5.80)
RDW: 12.4 % (ref 12.3–15.4)
WBC: 3.5 10*3/uL (ref 3.4–10.8)

## 2016-07-14 LAB — HEPATIC FUNCTION PANEL
ALBUMIN: 3.7 g/dL (ref 3.5–5.5)
ALK PHOS: 61 IU/L (ref 39–117)
ALT: 30 IU/L (ref 0–44)
AST: 23 IU/L (ref 0–40)
Bilirubin Total: 0.4 mg/dL (ref 0.0–1.2)
Bilirubin, Direct: 0.1 mg/dL (ref 0.00–0.40)
TOTAL PROTEIN: 6.5 g/dL (ref 6.0–8.5)

## 2016-07-14 LAB — PSA: Prostate Specific Ag, Serum: 0.7 ng/mL (ref 0.0–4.0)

## 2016-07-14 LAB — BASIC METABOLIC PANEL
BUN/Creatinine Ratio: 8 — ABNORMAL LOW (ref 9–20)
BUN: 11 mg/dL (ref 6–24)
CALCIUM: 9.1 mg/dL (ref 8.7–10.2)
CO2: 26 mmol/L (ref 18–29)
Chloride: 104 mmol/L (ref 96–106)
Creatinine, Ser: 1.42 mg/dL — ABNORMAL HIGH (ref 0.76–1.27)
GFR, EST AFRICAN AMERICAN: 68 mL/min/{1.73_m2} (ref >59)
GFR, EST NON AFRICAN AMERICAN: 58 mL/min/{1.73_m2} — AB (ref >59)
Glucose: 113 mg/dL — ABNORMAL HIGH (ref 65–99)
POTASSIUM: 4.7 mmol/L (ref 3.5–5.2)
SODIUM: 144 mmol/L (ref 134–144)

## 2016-07-14 LAB — TSH: TSH: 1.06 u[IU]/mL (ref 0.450–4.500)

## 2016-07-17 ENCOUNTER — Encounter (INDEPENDENT_AMBULATORY_CARE_PROVIDER_SITE_OTHER): Payer: Self-pay

## 2016-07-17 ENCOUNTER — Ambulatory Visit (INDEPENDENT_AMBULATORY_CARE_PROVIDER_SITE_OTHER): Payer: 59 | Admitting: Internal Medicine

## 2016-07-17 ENCOUNTER — Encounter: Payer: Self-pay | Admitting: Internal Medicine

## 2016-07-17 DIAGNOSIS — Z86010 Personal history of colonic polyps: Secondary | ICD-10-CM | POA: Diagnosis not present

## 2016-07-17 DIAGNOSIS — E7800 Pure hypercholesterolemia, unspecified: Secondary | ICD-10-CM

## 2016-07-17 DIAGNOSIS — G8929 Other chronic pain: Secondary | ICD-10-CM

## 2016-07-17 DIAGNOSIS — M25569 Pain in unspecified knee: Secondary | ICD-10-CM

## 2016-07-17 DIAGNOSIS — E663 Overweight: Secondary | ICD-10-CM | POA: Diagnosis not present

## 2016-07-17 NOTE — Progress Notes (Signed)
Patient ID: Dean Callahan, male   DOB: 07/12/1971, 45 y.o.   MRN: [id number]   Subjective:    Patient ID: Dean Callahan, male    DOB: 19-May-1971, 45 y.o.   MRN: [id number]  HPI  Patient here for scheduled follow up and to have form completion for insurance.  He has recently started exercising more.  Has adjusted his diet.  Losing weight.  Discussed diet and exercise.  No chest pain.  No sob.  No acid reflux.  No abdominal pain or cramping.  Bowels stable.     Past Medical History:  Diagnosis Date  . History of chicken pox   . Hypercholesterolemia    Past Surgical History:  Procedure Laterality Date  . ACHILLES TENDON SURGERY Right   . ANKLE SURGERY Left   . ANTERIOR CRUCIATE LIGAMENT REPAIR Right    Family History  Problem Relation Age of Onset  . Diabetes Mother   . Hyperlipidemia Father   . Heart disease Paternal Grandmother     heart attack   Social History   Social History  . Marital status: Married    Spouse name: N/A  . Number of children: 3  . Years of education: N/A   Occupational History  . manager    Social History Main Topics  . Smoking status: Never Smoker  . Smokeless tobacco: Never Used  . Alcohol use No  . Drug use: No  . Sexual activity: Not Asked   Other Topics Concern  . None   Social History Narrative  . None    No outpatient encounter prescriptions on file as of 07/17/2016.   No facility-administered encounter medications on file as of 07/17/2016.     Review of Systems  Constitutional: Negative for appetite change and unexpected weight change.  HENT: Negative for congestion and sinus pressure.   Respiratory: Negative for cough, chest tightness and shortness of breath.   Cardiovascular: Negative for chest pain, palpitations and leg swelling.  Gastrointestinal: Negative for abdominal pain, diarrhea, nausea and vomiting.  Genitourinary: Negative for difficulty urinating and dysuria.  Musculoskeletal: Negative for back pain and joint  swelling.  Skin: Negative for color change and rash.  Neurological: Negative for dizziness, light-headedness and headaches.  Psychiatric/Behavioral: Negative for agitation and dysphoric mood.       Objective:     Blood pressure rechecked by me:  110/68  Physical Exam  Constitutional: He appears well-developed and well-nourished. No distress.  HENT:  Nose: Nose normal.  Mouth/Throat: Oropharynx is clear and moist.  Neck: Neck supple. No thyromegaly present.  Cardiovascular: Normal rate and regular rhythm.   Pulmonary/Chest: Effort normal and breath sounds normal. No respiratory distress.  Abdominal: Soft. Bowel sounds are normal. There is no tenderness.  Musculoskeletal: He exhibits no edema or tenderness.  Knees stable.   Lymphadenopathy:    He has no cervical adenopathy.  Skin: No rash noted. No erythema.  Psychiatric: He has a normal mood and affect. His behavior is normal.    BP (!) 140/92   Pulse 81   Temp 98.8 F (37.1 C) (Oral)   Ht 5' 10.5" (1.791 m)   Wt 225 lb 9.6 oz (102.3 kg)   SpO2 96%   BMI 31.91 kg/m  Wt Readings from Last 3 Encounters:  07/17/16 225 lb 9.6 oz (102.3 kg)  12/02/15 223 lb 6 oz (101.3 kg)  07/22/15 209 lb (94.8 kg)     Lab Results  Component Value Date   WBC 3.5 07/13/2016  HCT 43.1 07/13/2016   PLT 225 07/13/2016   GLUCOSE 113 (H) 07/13/2016   CHOL 203 (H) 07/13/2016   TRIG 56 07/13/2016   HDL 53 07/13/2016   LDLCALC 139 (H) 07/13/2016   ALT 30 07/13/2016   AST 23 07/13/2016   NA 144 07/13/2016   K 4.7 07/13/2016   CL 104 07/13/2016   CREATININE 1.42 (H) 07/13/2016   BUN 11 07/13/2016   CO2 26 07/13/2016   TSH 1.060 07/13/2016   HGBA1C 5.0 05/20/2015        Assessment & Plan:   Problem List Items Addressed This Visit    History of colonic polyps    Colonoscopy 02/2015.        Hypercholesterolemia    Low cholesterol diet and exercise.  Follow lipid panel.        Knee pain    Stable.        Overweight (BMI  25.0-29.9)    Discussed diet and exercise.  He has adjusted his diet.  Has started back exercising.  Follow.  Form completed.         Other Visit Diagnoses   None.      Einar Pheasant, MD

## 2016-07-17 NOTE — Progress Notes (Signed)
Pre visit review using our clinic review tool, if applicable. No additional management support is needed unless otherwise documented below in the visit note. 

## 2016-07-19 ENCOUNTER — Telehealth: Payer: Self-pay | Admitting: Internal Medicine

## 2016-07-19 DIAGNOSIS — D709 Neutropenia, unspecified: Secondary | ICD-10-CM

## 2016-07-19 DIAGNOSIS — R739 Hyperglycemia, unspecified: Secondary | ICD-10-CM

## 2016-07-19 NOTE — Telephone Encounter (Signed)
Orders placed for labs

## 2016-07-23 ENCOUNTER — Encounter: Payer: Self-pay | Admitting: Internal Medicine

## 2016-07-23 NOTE — Assessment & Plan Note (Signed)
Discussed diet and exercise.  He has adjusted his diet.  Has started back exercising.  Follow.  Form completed.

## 2016-07-23 NOTE — Assessment & Plan Note (Signed)
Colonoscopy 02/2015.

## 2016-07-23 NOTE — Assessment & Plan Note (Signed)
Low cholesterol diet and exercise.  Follow lipid panel.   

## 2016-07-23 NOTE — Assessment & Plan Note (Signed)
Stable

## 2016-09-11 ENCOUNTER — Telehealth: Payer: Self-pay | Admitting: Surgical

## 2016-09-11 DIAGNOSIS — R739 Hyperglycemia, unspecified: Secondary | ICD-10-CM

## 2016-09-11 DIAGNOSIS — R7989 Other specified abnormal findings of blood chemistry: Secondary | ICD-10-CM

## 2016-09-11 NOTE — Telephone Encounter (Signed)
Pt left vm returning your call. Call pt @ [phone]. Thank you!

## 2016-09-11 NOTE — Telephone Encounter (Signed)
LM for patient to return call to office. BS is slightly elevated at 113. Need to do a low carb diet. Creatinine at 1.42 needs to stay hydrated. Recheck labs in 09/2016.

## 2016-09-11 NOTE — Telephone Encounter (Signed)
Notified patient of lab results. Please place order so he can go to [facility] to have drawn

## 2016-09-12 NOTE — Telephone Encounter (Signed)
Orders placed for labs to be drawn at [facility] 

## 2017-01-19 ENCOUNTER — Encounter: Payer: Self-pay | Admitting: Internal Medicine

## 2017-01-19 ENCOUNTER — Ambulatory Visit (INDEPENDENT_AMBULATORY_CARE_PROVIDER_SITE_OTHER): Payer: 59 | Admitting: Internal Medicine

## 2017-01-19 VITALS — BP 140/80 | HR 88 | Temp 99.1°F | Resp 12 | Ht 71.0 in | Wt 225.0 lb

## 2017-01-19 DIAGNOSIS — E7800 Pure hypercholesterolemia, unspecified: Secondary | ICD-10-CM

## 2017-01-19 DIAGNOSIS — Z0001 Encounter for general adult medical examination with abnormal findings: Secondary | ICD-10-CM | POA: Diagnosis not present

## 2017-01-19 DIAGNOSIS — R7989 Other specified abnormal findings of blood chemistry: Secondary | ICD-10-CM

## 2017-01-19 DIAGNOSIS — Z860100 Personal history of colon polyps, unspecified: Secondary | ICD-10-CM

## 2017-01-19 DIAGNOSIS — Z0000 Encounter for general adult medical examination without abnormal findings: Secondary | ICD-10-CM

## 2017-01-19 NOTE — Progress Notes (Signed)
Pre-visit discussion using our clinic review tool. No additional management support is needed unless otherwise documented below in the visit note.  

## 2017-01-19 NOTE — Assessment & Plan Note (Addendum)
Physical today 01/19/17.  PSA 07/13/16 - .7.

## 2017-01-19 NOTE — Progress Notes (Signed)
Patient ID: Dean Callahan, male   DOB: 1971-07-06, 46 y.o.   MRN: [id number]   Subjective:    Patient ID: Dean Callahan, male    DOB: 02/05/1971, 46 y.o.   MRN: [id number]  HPI  Patient here for a physical exam.  States he is doing well.  Stays active.  Is exercising.  No chest pain.  No sob.  No acid reflux.  No abdominal pain.  Bowels moving.  No urine change.  Job going well.     Past Medical History:  Diagnosis Date  . History of chicken pox   . Hypercholesterolemia    Past Surgical History:  Procedure Laterality Date  . ACHILLES TENDON SURGERY Right   . ANKLE SURGERY Left   . ANTERIOR CRUCIATE LIGAMENT REPAIR Right    Family History  Problem Relation Age of Onset  . Diabetes Mother   . Hyperlipidemia Father   . Heart disease Paternal Grandmother     heart attack   Social History   Social History  . Marital status: Married    Spouse name: N/A  . Number of children: 3  . Years of education: N/A   Occupational History  . manager    Social History Main Topics  . Smoking status: Never Smoker  . Smokeless tobacco: Never Used  . Alcohol use No  . Drug use: No  . Sexual activity: Not Asked   Other Topics Concern  . None   Social History Narrative  . None    No outpatient encounter prescriptions on file as of 01/19/2017.   No facility-administered encounter medications on file as of 01/19/2017.     Review of Systems  Constitutional: Negative for appetite change and unexpected weight change.  HENT: Negative for congestion and sinus pressure.   Eyes: Negative for pain and visual disturbance.  Respiratory: Negative for cough, chest tightness and shortness of breath.   Cardiovascular: Negative for chest pain, palpitations and leg swelling.  Gastrointestinal: Negative for abdominal pain, diarrhea, nausea and vomiting.  Genitourinary: Negative for difficulty urinating and dysuria.  Musculoskeletal: Negative for back pain and joint swelling.  Skin: Negative for  color change and rash.  Neurological: Negative for dizziness, light-headedness and headaches.  Hematological: Negative for adenopathy. Does not bruise/bleed easily.  Psychiatric/Behavioral: Negative for agitation and dysphoric mood.       Objective:     Blood pressure rechecked by me:  128/78  Physical Exam  Constitutional: He is oriented to person, place, and time. He appears well-developed and well-nourished. No distress.  HENT:  Head: Normocephalic and atraumatic.  Nose: Nose normal.  Mouth/Throat: Oropharynx is clear and moist. No oropharyngeal exudate.  Eyes: Conjunctivae are normal. Right eye exhibits no discharge. Left eye exhibits no discharge.  Neck: Neck supple. No thyromegaly present.  Cardiovascular: Normal rate and regular rhythm.   Pulmonary/Chest: Breath sounds normal. No respiratory distress. He has no wheezes.  Abdominal: Soft. Bowel sounds are normal. There is no tenderness.  Genitourinary:  Genitourinary Comments: Rectal exam - no palpable prostate nodules.  Heme negative.    Musculoskeletal: He exhibits no edema or tenderness.  Lymphadenopathy:    He has no cervical adenopathy.  Neurological: He is alert and oriented to person, place, and time.  Skin: Skin is warm and dry. No rash noted. No erythema.  Psychiatric: He has a normal mood and affect. His behavior is normal.    BP 140/80 (BP Location: Left Arm, Patient Position: Sitting, Cuff Size: Large)   Pulse  88   Temp 99.1 F (37.3 C) (Oral)   Resp 12   Ht 5\' 11"  (1.803 m)   Wt 225 lb (102.1 kg)   SpO2 97%   BMI 31.38 kg/m  Wt Readings from Last 3 Encounters:  01/19/17 225 lb (102.1 kg)  07/17/16 225 lb 9.6 oz (102.3 kg)  12/02/15 223 lb 6 oz (101.3 kg)     Lab Results  Component Value Date   WBC 3.5 07/13/2016   HCT 43.1 07/13/2016   PLT 225 07/13/2016   GLUCOSE 113 (H) 07/13/2016   CHOL 203 (H) 07/13/2016   TRIG 56 07/13/2016   HDL 53 07/13/2016   LDLCALC 139 (H) 07/13/2016   ALT 30  07/13/2016   AST 23 07/13/2016   NA 144 07/13/2016   K 4.7 07/13/2016   CL 104 07/13/2016   CREATININE 1.42 (H) 07/13/2016   BUN 11 07/13/2016   CO2 26 07/13/2016   TSH 1.060 07/13/2016   HGBA1C 5.0 05/20/2015        Assessment & Plan:   Problem List Items Addressed This Visit    Elevated serum creatinine    Discussed increased muscle mass.  Stay hydrated.  Follow metabolic panel.       Health care maintenance    Physical today 01/19/17.  PSA 07/13/16 - .7.        History of colonic polyps    Colonoscopy 03/04/15 - two ascending colon polyps and mild diverticulosis.        Hypercholesterolemia - Primary    Low cholesterol diet and exercise.  Follow lipid panel.        Relevant Orders   Lipid panel       Einar Pheasant, MD

## 2017-01-28 ENCOUNTER — Encounter: Payer: Self-pay | Admitting: Internal Medicine

## 2017-01-28 NOTE — Assessment & Plan Note (Signed)
Discussed increased muscle mass.  Stay hydrated.  Follow metabolic panel.

## 2017-01-28 NOTE — Assessment & Plan Note (Signed)
Low cholesterol diet and exercise.  Follow lipid panel.   

## 2017-01-28 NOTE — Assessment & Plan Note (Signed)
Colonoscopy 03/04/15 - two ascending colon polyps and mild diverticulosis.

## 2018-01-22 ENCOUNTER — Encounter: Payer: 59 | Admitting: Internal Medicine

## 2018-07-08 ENCOUNTER — Telehealth: Payer: Self-pay | Admitting: Internal Medicine

## 2018-07-08 NOTE — Telephone Encounter (Signed)
Pt wife dropped off [employer] forms to be filled out. Placed in Dr. Bary Leriche color folder upfront  Pt last office visit was 05/21/17 Pt wife didn't want to make appt because she didn't feel that it was need  Please contact when completed

## 2018-07-09 NOTE — Telephone Encounter (Signed)
Left message for wife to call back. He will need appt to complete form per Dr. Nicki Reaper

## 2018-07-09 NOTE — Telephone Encounter (Signed)
Pt scheduled  

## 2018-07-19 ENCOUNTER — Encounter: Payer: Self-pay | Admitting: Internal Medicine

## 2018-07-19 ENCOUNTER — Ambulatory Visit: Payer: Managed Care, Other (non HMO) | Admitting: Internal Medicine

## 2018-07-19 VITALS — BP 132/80 | HR 100 | Temp 98.3°F | Resp 18 | Wt 237.0 lb

## 2018-07-19 DIAGNOSIS — K219 Gastro-esophageal reflux disease without esophagitis: Secondary | ICD-10-CM

## 2018-07-19 DIAGNOSIS — R0789 Other chest pain: Secondary | ICD-10-CM

## 2018-07-19 DIAGNOSIS — E7800 Pure hypercholesterolemia, unspecified: Secondary | ICD-10-CM | POA: Diagnosis not present

## 2018-07-19 NOTE — Progress Notes (Signed)
Patient ID: Dean Callahan, male   DOB: 02-Jan-1971, 47 y.o.   MRN: [id number]   Subjective:    Patient ID: Dean Callahan, male    DOB: 1971-03-03, 47 y.o.   MRN: [id number]  HPI  Patient here as a work in for work form completion.  States he is doing relativley well.  Staying active.  Still exercising.  States that approximately >8 months ago, he was lifting weights and dropped 185 pound weight/bar on his chest.  Chest was ore for a while.  Resolved.  He then noticed some intermittent chest tightness.  Was questioning if could have been related to this previous injury.  Has not noticed any chest pain or tightness for the last 3-4 months.  No chest pain or tightness with increased activity or exertion.  States he does not work out as much and has noticed being out of shape, but otherwise no problems with exercise.  Some acid reflux at night.  If he adjust his diet and does not eat late, does not have this issue.  No abdominal apin.  Bowels moving.  No urine or bowel change.  Handling stress.  Discussed labs.  Discussed elevated cholesterol.  Calculated cholesterol risk is <7.5%.  Discussed MMR results and need for vaccination if cannot show proof of previous injections.     Past Medical History:  Diagnosis Date  . History of chicken pox   . Hypercholesterolemia    Past Surgical History:  Procedure Laterality Date  . ACHILLES TENDON SURGERY Right   . ANKLE SURGERY Left   . ANTERIOR CRUCIATE LIGAMENT REPAIR Right    Family History  Problem Relation Age of Onset  . Diabetes Mother   . Hyperlipidemia Father   . Heart disease Paternal Grandmother        heart attack   Social History   Socioeconomic History  . Marital status: Married    Spouse name: Not on file  . Number of children: 3  . Years of education: Not on file  . Highest education level: Not on file  Occupational History  . Occupation: Freight forwarder  Social Needs  . Financial resource strain: Not on file  . Food insecurity:   Worry: Not on file    Inability: Not on file  . Transportation needs:    Medical: Not on file    Non-medical: Not on file  Tobacco Use  . Smoking status: Never Smoker  . Smokeless tobacco: Never Used  Substance and Sexual Activity  . Alcohol use: No    Alcohol/week: 0.0 standard drinks  . Drug use: No  . Sexual activity: Not on file  Lifestyle  . Physical activity:    Days per week: Not on file    Minutes per session: Not on file  . Stress: Not on file  Relationships  . Social connections:    Talks on phone: Not on file    Gets together: Not on file    Attends religious service: Not on file    Active member of club or organization: Not on file    Attends meetings of clubs or organizations: Not on file    Relationship status: Not on file  Other Topics Concern  . Not on file  Social History Narrative  . Not on file    No outpatient encounter medications on file as of 07/19/2018.   No facility-administered encounter medications on file as of 07/19/2018.     Review of Systems  Constitutional: Negative for appetite change  and unexpected weight change.  HENT: Negative for congestion and sinus pressure.   Respiratory: Negative for cough and shortness of breath.        No chest tightness recently.    Cardiovascular: Negative for chest pain, palpitations and leg swelling.  Gastrointestinal: Negative for abdominal pain, diarrhea, nausea and vomiting.  Genitourinary: Negative for difficulty urinating and dysuria.  Musculoskeletal: Negative for joint swelling and myalgias.  Skin: Negative for color change and rash.  Neurological: Negative for dizziness, light-headedness and headaches.  Psychiatric/Behavioral: Negative for agitation and dysphoric mood.       Objective:     Blood pressure rechecked by me:  110/74  Physical Exam  Constitutional: He appears well-developed and well-nourished. No distress.  HENT:  Nose: Nose normal.  Mouth/Throat: Oropharynx is clear and  moist.  Neck: Neck supple. No thyromegaly present.  Cardiovascular: Normal rate and regular rhythm.  Pulmonary/Chest: Effort normal and breath sounds normal. No respiratory distress.  Abdominal: Soft. Bowel sounds are normal. There is no tenderness.  Musculoskeletal: He exhibits no edema or tenderness.  Lymphadenopathy:    He has no cervical adenopathy.  Skin: No rash noted. No erythema.  Psychiatric: He has a normal mood and affect. His behavior is normal.    BP 132/80 (BP Location: Left Arm, Patient Position: Sitting, Cuff Size: Large)   Pulse 100   Temp 98.3 F (36.8 C) (Oral)   Resp 18   Wt 237 lb (107.5 kg)   SpO2 96%   BMI 33.05 kg/m  Wt Readings from Last 3 Encounters:  07/19/18 237 lb (107.5 kg)  01/19/17 225 lb (102.1 kg)  07/17/16 225 lb 9.6 oz (102.3 kg)     Lab Results  Component Value Date   WBC 3.5 07/13/2016   HGB 14.8 07/13/2016   HCT 43.1 07/13/2016   PLT 225 07/13/2016   GLUCOSE 113 (H) 07/13/2016   CHOL 203 (H) 07/13/2016   TRIG 56 07/13/2016   HDL 53 07/13/2016   LDLCALC 139 (H) 07/13/2016   ALT 30 07/13/2016   AST 23 07/13/2016   NA 144 07/13/2016   K 4.7 07/13/2016   CL 104 07/13/2016   CREATININE 1.42 (H) 07/13/2016   BUN 11 07/13/2016   CO2 26 07/13/2016   TSH 1.060 07/13/2016   HGBA1C 5.0 05/20/2015        Assessment & Plan:   Problem List Items Addressed This Visit    Chest tightness - Primary    Noticed the chest tightness as outlined.  Had the previous injury.  Unclear if related.  No symptoms for the last 3-4 months.  No chest pain or tightness with increased activity or exertion.  EKG - SR with no acute ischemic changes.  Discussed further w/up.  He declines at this time.  Wants to follow.  Will notify me if desires any further evaluation or if any change in symptoms.        Relevant Orders   EKG 12-Lead (Completed)   GERD (gastroesophageal reflux disease)    If watches his diet and controls when he eats, better.  Follow.          Hypercholesterolemia    Low cholesterol diet and exercise.  Follow lipid panel.            Einar Pheasant, MD

## 2018-07-21 ENCOUNTER — Encounter: Payer: Self-pay | Admitting: Internal Medicine

## 2018-07-21 DIAGNOSIS — R0789 Other chest pain: Secondary | ICD-10-CM | POA: Insufficient documentation

## 2018-07-21 NOTE — Assessment & Plan Note (Signed)
If watches his diet and controls when he eats, better.  Follow.

## 2018-07-21 NOTE — Assessment & Plan Note (Signed)
Low cholesterol diet and exercise.  Follow lipid panel.   

## 2018-07-21 NOTE — Assessment & Plan Note (Signed)
Noticed the chest tightness as outlined.  Had the previous injury.  Unclear if related.  No symptoms for the last 3-4 months.  No chest pain or tightness with increased activity or exertion.  EKG - SR with no acute ischemic changes.  Discussed further w/up.  He declines at this time.  Wants to follow.  Will notify me if desires any further evaluation or if any change in symptoms.

## 2018-09-16 ENCOUNTER — Ambulatory Visit (INDEPENDENT_AMBULATORY_CARE_PROVIDER_SITE_OTHER): Payer: Managed Care, Other (non HMO) | Admitting: Internal Medicine

## 2018-09-16 ENCOUNTER — Encounter: Payer: Self-pay | Admitting: Internal Medicine

## 2018-09-16 VITALS — BP 116/78 | HR 98 | Temp 98.4°F | Ht 71.5 in | Wt 236.2 lb

## 2018-09-16 DIAGNOSIS — Z86010 Personal history of colonic polyps: Secondary | ICD-10-CM

## 2018-09-16 DIAGNOSIS — R739 Hyperglycemia, unspecified: Secondary | ICD-10-CM

## 2018-09-16 DIAGNOSIS — Z125 Encounter for screening for malignant neoplasm of prostate: Secondary | ICD-10-CM

## 2018-09-16 DIAGNOSIS — E7800 Pure hypercholesterolemia, unspecified: Secondary | ICD-10-CM

## 2018-09-16 DIAGNOSIS — Z0000 Encounter for general adult medical examination without abnormal findings: Secondary | ICD-10-CM

## 2018-09-16 DIAGNOSIS — M79604 Pain in right leg: Secondary | ICD-10-CM

## 2018-09-16 NOTE — Assessment & Plan Note (Signed)
Physical today 09/16/18.  Check psa.

## 2018-09-16 NOTE — Patient Instructions (Signed)
Go by and have labs drawn at [facility] over the next 2-3 weeks.

## 2018-09-16 NOTE — Progress Notes (Addendum)
Patient ID: Dean Callahan, male   DOB: [birth year], 47 y.o.   MRN: [id number]   Subjective:    Patient ID: Dean Callahan, male    DOB: 01/17/1971, 47 y.o.   MRN: [id number]  HPI  Patient here for a scheduled follow up.  He reports he is doing well.  Feels good. Stays active.  No chest pain.  No sob.  No acid reflux.  No abdominal pain.  Bowels moving.  No urine change.  Does report a pulling sensation in his right medial thigh.  Has been present for a couple of months.  Better with certain stretches, etc.  No injury.  Not a persistent pain.  No rash.  No back pain.     Past Medical History:  Diagnosis Date  . History of chicken pox   . Hypercholesterolemia    Past Surgical History:  Procedure Laterality Date  . ACHILLES TENDON SURGERY Right   . ANKLE SURGERY Left   . ANTERIOR CRUCIATE LIGAMENT REPAIR Right    Family History  Problem Relation Age of Onset  . Diabetes Mother   . Hyperlipidemia Father   . Heart disease Paternal Grandmother        heart attack   Social History   Socioeconomic History  . Marital status: Married    Spouse name: Not on file  . Number of children: 3  . Years of education: Not on file  . Highest education level: Not on file  Occupational History  . Occupation: Freight forwarder  Social Needs  . Financial resource strain: Not on file  . Food insecurity:    Worry: Not on file    Inability: Not on file  . Transportation needs:    Medical: Not on file    Non-medical: Not on file  Tobacco Use  . Smoking status: Never Smoker  . Smokeless tobacco: Never Used  Substance and Sexual Activity  . Alcohol use: No    Alcohol/week: 0.0 standard drinks  . Drug use: No  . Sexual activity: Not on file  Lifestyle  . Physical activity:    Days per week: Not on file    Minutes per session: Not on file  . Stress: Not on file  Relationships  . Social connections:    Talks on phone: Not on file    Gets together: Not on file    Attends religious service: Not on  file    Active member of club or organization: Not on file    Attends meetings of clubs or organizations: Not on file    Relationship status: Not on file  Other Topics Concern  . Not on file  Social History Narrative  . Not on file    No outpatient encounter medications on file as of 09/16/2018.   No facility-administered encounter medications on file as of 09/16/2018.     Review of Systems  Constitutional: Negative for appetite change and unexpected weight change.  HENT: Negative for congestion and sinus pressure.   Eyes: Negative for pain and visual disturbance.  Respiratory: Negative for cough, chest tightness and shortness of breath.   Cardiovascular: Negative for chest pain, palpitations and leg swelling.  Gastrointestinal: Negative for abdominal pain, diarrhea and nausea.  Genitourinary: Negative for difficulty urinating and dysuria.  Musculoskeletal: Negative for joint swelling and myalgias.       Right medial thigh discomfort as outlined.    Skin: Negative for color change and rash.  Neurological: Negative for dizziness and headaches.  Hematological: Negative  for adenopathy. Does not bruise/bleed easily.  Psychiatric/Behavioral: Negative for decreased concentration and dysphoric mood.       Objective:    Physical Exam Constitutional:      General: He is not in acute distress.    Appearance: Normal appearance. He is well-developed.  HENT:     Head: Normocephalic and atraumatic.     Nose: Nose normal. No congestion.     Mouth/Throat:     Pharynx: No oropharyngeal exudate or posterior oropharyngeal erythema.  Eyes:     General:        Right eye: No discharge.        Left eye: No discharge.     Conjunctiva/sclera: Conjunctivae normal.  Neck:     Musculoskeletal: Neck supple. No muscular tenderness.     Thyroid: No thyromegaly.  Cardiovascular:     Rate and Rhythm: Normal rate and regular rhythm.  Pulmonary:     Effort: No respiratory distress.     Breath  sounds: Normal breath sounds. No wheezing.  Abdominal:     General: Bowel sounds are normal.     Palpations: Abdomen is soft.     Tenderness: There is no abdominal tenderness.  Genitourinary:    Comments: Not performed.   Musculoskeletal:        General: No swelling or tenderness.     Comments: No pain with SLR.  No pain with abduction/adduction.  No pain to palpation.    Lymphadenopathy:     Cervical: No cervical adenopathy.  Skin:    Findings: No erythema or rash.  Neurological:     Mental Status: He is alert and oriented to person, place, and time.  Psychiatric:        Mood and Affect: Mood normal.        Behavior: Behavior normal.     BP 116/78 (BP Location: Left Arm, Patient Position: Sitting, Cuff Size: Large)   Pulse 98   Temp 98.4 F (36.9 C)   Ht 5' 11.5" (1.816 m)   Wt 236 lb 3.2 oz (107.1 kg)   SpO2 96%   BMI 32.48 kg/m  Wt Readings from Last 3 Encounters:  09/16/18 236 lb 3.2 oz (107.1 kg)  07/19/18 237 lb (107.5 kg)  01/19/17 225 lb (102.1 kg)     Lab Results  Component Value Date   WBC 3.5 07/13/2016   HGB 14.8 07/13/2016   HCT 43.1 07/13/2016   PLT 225 07/13/2016   GLUCOSE 113 (H) 07/13/2016   CHOL 203 (H) 07/13/2016   TRIG 56 07/13/2016   HDL 53 07/13/2016   LDLCALC 139 (H) 07/13/2016   ALT 30 07/13/2016   AST 23 07/13/2016   NA 144 07/13/2016   K 4.7 07/13/2016   CL 104 07/13/2016   CREATININE 1.42 (H) 07/13/2016   BUN 11 07/13/2016   CO2 26 07/13/2016   TSH 1.060 07/13/2016   HGBA1C 5.0 05/20/2015       Assessment & Plan:   Problem List Items Addressed This Visit    Health care maintenance    Physical today 09/16/18.  Check psa.        History of colonic polyps    Colonoscopy 02/2015 - two ascending polyps and mild diverticulosis.        Hypercholesterolemia    Low cholesterol diet and exercise.  Follow lipid panel.        Relevant Orders   CBC with Differential/Platelet   Hepatic function panel   Lipid panel   TSH  Basic metabolic panel   Leg pain    Other Visit Diagnoses    Routine general medical examination at a health care facility    -  Primary   Prostate cancer screening       Relevant Orders   PSA   Hyperglycemia       Relevant Orders   Hemoglobin A1c       Einar Pheasant, MD

## 2018-09-22 ENCOUNTER — Encounter: Payer: Self-pay | Admitting: Internal Medicine

## 2018-09-22 DIAGNOSIS — M79606 Pain in leg, unspecified: Secondary | ICD-10-CM | POA: Insufficient documentation

## 2018-09-22 NOTE — Assessment & Plan Note (Signed)
Colonoscopy 02/2015 - two ascending polyps and mild diverticulosis.

## 2018-09-22 NOTE — Assessment & Plan Note (Signed)
Low cholesterol diet and exercise.  Follow lipid panel.   

## 2019-09-17 ENCOUNTER — Other Ambulatory Visit: Payer: Self-pay

## 2019-09-17 ENCOUNTER — Ambulatory Visit (INDEPENDENT_AMBULATORY_CARE_PROVIDER_SITE_OTHER): Payer: Managed Care, Other (non HMO) | Admitting: Internal Medicine

## 2019-09-17 DIAGNOSIS — Z125 Encounter for screening for malignant neoplasm of prostate: Secondary | ICD-10-CM | POA: Diagnosis not present

## 2019-09-17 DIAGNOSIS — E7800 Pure hypercholesterolemia, unspecified: Secondary | ICD-10-CM | POA: Diagnosis not present

## 2019-09-17 DIAGNOSIS — Z86010 Personal history of colonic polyps: Secondary | ICD-10-CM

## 2019-09-17 DIAGNOSIS — Z0000 Encounter for general adult medical examination without abnormal findings: Secondary | ICD-10-CM | POA: Diagnosis not present

## 2019-09-17 NOTE — Progress Notes (Signed)
Patient ID: Dean Callahan, male   DOB: [birth year], 48 y.o.   MRN: [id number]   Virtual Visit via video Note  This visit type was conducted due to national recommendations for restrictions regarding the COVID-19 pandemic (e.g. social distancing).  This format is felt to be most appropriate for this patient at this time.  All issues noted in this document were discussed and addressed.  No physical exam was performed (except for noted visual exam findings with Video Visits).   I connected with Dean Callahan by a video enabled telemedicine application and verified that I am speaking with the correct person using two identifiers. Location patient: home Location provider: work  Persons participating in the virtual visit: patient, provider  I discussed the limitations, risks, security and privacy concerns of performing an evaluation and management service by video and the availability of in person appointments. The patient expressed understanding and agreed to proceed.   Reason for visit: physical  HPI: He reports he is doing well.  Feels good.  Staying active.  Does exercise.  No chest pain or sob with increased activity or exertion.  No acid reflux or abdominal pain reported.   Bowels moving.  No blood in stool.  Allergies - not a significant issue for him.  Legs/thigh/joints - doing ok.  Does not limit his activity.    ROS: See pertinent positives and negatives per HPI.  Past Medical History:  Diagnosis Date  . History of chicken pox   . Hypercholesterolemia     Past Surgical History:  Procedure Laterality Date  . ACHILLES TENDON SURGERY Right   . ANKLE SURGERY Left   . ANTERIOR CRUCIATE LIGAMENT REPAIR Right     Family History  Problem Relation Age of Onset  . Diabetes Mother   . Hyperlipidemia Father   . Heart disease Paternal Grandmother        heart attack    SOCIAL HX: reviewed  No current outpatient medications on file.  EXAM:  GENERAL: alert, oriented, appears well  and in no acute distress  HEENT: atraumatic, conjunttiva clear, no obvious abnormalities on inspection of external nose and ears  NECK: normal movements of the head and neck  LUNGS: on inspection no signs of respiratory distress, breathing rate appears normal, no obvious gross SOB, gasping or wheezing  CV: no obvious cyanosis  PSYCH/NEURO: pleasant and cooperative, no obvious depression or anxiety, speech and thought processing grossly intact  ASSESSMENT AND PLAN:  Discussed the following assessment and plan:  Health care maintenance Physical today 09/17/19.  Check psa.  Colonoscopy 02/2015.    History of colonic polyps Colonoscopy 02/2015 - two ascending polyps and mild diverticulosis.  (tubular adenoma)  Hypercholesterolemia Low cholesterol diet and exercise.  Follow lipid panel.      I discussed the assessment and treatment plan with the patient. The patient was provided an opportunity to ask questions and all were answered. The patient agreed with the plan and demonstrated an understanding of the instructions.   The patient was advised to call back or seek an in-person evaluation if the symptoms worsen or if the condition fails to improve as anticipated.   Einar Pheasant, MD

## 2019-09-21 ENCOUNTER — Encounter: Payer: Self-pay | Admitting: Internal Medicine

## 2019-09-21 NOTE — Assessment & Plan Note (Signed)
Low cholesterol diet and exercise.  Follow lipid panel.   

## 2019-09-21 NOTE — Assessment & Plan Note (Signed)
Physical today 09/17/19.  Check psa.  Colonoscopy 02/2015.

## 2019-09-21 NOTE — Assessment & Plan Note (Addendum)
Colonoscopy 02/2015 - two ascending polyps and mild diverticulosis.  (tubular adenoma)

## 2019-11-22 ENCOUNTER — Telehealth: Payer: Self-pay | Admitting: Internal Medicine

## 2019-11-22 DIAGNOSIS — Z125 Encounter for screening for malignant neoplasm of prostate: Secondary | ICD-10-CM

## 2019-11-22 DIAGNOSIS — R739 Hyperglycemia, unspecified: Secondary | ICD-10-CM

## 2019-11-22 LAB — HEPATIC FUNCTION PANEL
ALT: 34 IU/L (ref 0–44)
AST: 25 IU/L (ref 0–40)
Albumin: 4.1 g/dL (ref 4.0–5.0)
Alkaline Phosphatase: 66 IU/L (ref 39–117)
Bilirubin Total: 0.4 mg/dL (ref 0.0–1.2)
Bilirubin, Direct: 0.1 mg/dL (ref 0.00–0.40)
Total Protein: 6.6 g/dL (ref 6.0–8.5)

## 2019-11-22 LAB — BASIC METABOLIC PANEL
BUN/Creatinine Ratio: 8 — ABNORMAL LOW (ref 9–20)
BUN: 11 mg/dL (ref 6–24)
CO2: 23 mmol/L (ref 20–29)
Calcium: 9.2 mg/dL (ref 8.7–10.2)
Chloride: 104 mmol/L (ref 96–106)
Creatinine, Ser: 1.33 mg/dL — ABNORMAL HIGH (ref 0.76–1.27)
GFR calc Af Amer: 72 mL/min/{1.73_m2} (ref >59)
GFR calc non Af Amer: 62 mL/min/{1.73_m2} (ref >59)
Glucose: 110 mg/dL — ABNORMAL HIGH (ref 65–99)
Potassium: 4.7 mmol/L (ref 3.5–5.2)
Sodium: 139 mmol/L (ref 134–144)

## 2019-11-22 LAB — CBC WITH DIFFERENTIAL/PLATELET
Basophils Absolute: 0.1 10*3/uL (ref 0.0–0.2)
Basos: 1 %
EOS (ABSOLUTE): 0.2 10*3/uL (ref 0.0–0.4)
Eos: 4 %
Hematocrit: 42.9 % (ref 37.5–51.0)
Hemoglobin: 14.7 g/dL (ref 13.0–17.7)
Immature Grans (Abs): 0 10*3/uL (ref 0.0–0.1)
Immature Granulocytes: 1 %
Lymphocytes Absolute: 2 10*3/uL (ref 0.7–3.1)
Lymphs: 47 %
MCH: 32.4 pg (ref 26.6–33.0)
MCHC: 34.3 g/dL (ref 31.5–35.7)
MCV: 95 fL (ref 79–97)
Monocytes Absolute: 0.5 10*3/uL (ref 0.1–0.9)
Monocytes: 10 %
Neutrophils Absolute: 1.6 10*3/uL (ref 1.4–7.0)
Neutrophils: 37 %
Platelets: 224 10*3/uL (ref 150–450)
RBC: 4.54 x10E6/uL (ref 4.14–5.80)
RDW: 11.7 % (ref 11.6–15.4)
WBC: 4.3 10*3/uL (ref 3.4–10.8)

## 2019-11-22 LAB — LIPID PANEL
Chol/HDL Ratio: 4.3 ratio (ref 0.0–5.0)
Cholesterol, Total: 219 mg/dL — ABNORMAL HIGH (ref 100–199)
HDL: 51 mg/dL (ref >39)
LDL Chol Calc (NIH): 155 mg/dL — ABNORMAL HIGH (ref 0–99)
Triglycerides: 75 mg/dL (ref 0–149)
VLDL Cholesterol Cal: 13 mg/dL (ref 5–40)

## 2019-11-22 LAB — PSA: Prostate Specific Ag, Serum: 1.3 ng/mL (ref 0.0–4.0)

## 2019-11-22 LAB — TSH: TSH: 1.08 u[IU]/mL (ref 0.450–4.500)

## 2019-11-22 NOTE — Telephone Encounter (Signed)
Orders placed for f/u labs.  

## 2019-11-24 ENCOUNTER — Telehealth: Payer: Self-pay | Admitting: Lab

## 2019-11-24 NOTE — Telephone Encounter (Signed)
Called Pt No answer, No VM setup.

## 2019-11-28 ENCOUNTER — Telehealth: Payer: Self-pay | Admitting: Internal Medicine

## 2019-11-28 NOTE — Telephone Encounter (Signed)
Pt was calling for lab results.

## 2019-11-28 NOTE — Telephone Encounter (Signed)
Please see result note 

## 2019-12-02 NOTE — Telephone Encounter (Signed)
Results given to pt

## 2019-12-02 NOTE — Telephone Encounter (Signed)
Pt called wanting to get lab results  °

## 2020-05-20 LAB — BASIC METABOLIC PANEL WITH GFR
Creatinine: 1.3 (ref 0.6–1.3)
Glucose: 103

## 2020-05-20 LAB — COMPREHENSIVE METABOLIC PANEL
GFR calc Af Amer: 75
GFR calc non Af Amer: 65

## 2020-05-20 LAB — HEMOGLOBIN A1C: Hemoglobin A1C: 5.3

## 2020-05-20 LAB — LIPID PANEL
Cholesterol: 227 — AB (ref 0–200)
HDL: 57 (ref 35–70)
LDL Cholesterol: 160
Triglycerides: 60 (ref 40–160)

## 2020-06-23 ENCOUNTER — Ambulatory Visit: Payer: Managed Care, Other (non HMO) | Admitting: Internal Medicine

## 2020-06-23 ENCOUNTER — Other Ambulatory Visit: Payer: Self-pay

## 2020-06-23 ENCOUNTER — Encounter: Payer: Self-pay | Admitting: Internal Medicine

## 2020-06-23 VITALS — BP 128/78 | HR 80 | Temp 98.2°F | Resp 16 | Ht 72.0 in | Wt 238.0 lb

## 2020-06-23 DIAGNOSIS — R200 Anesthesia of skin: Secondary | ICD-10-CM | POA: Diagnosis not present

## 2020-06-23 DIAGNOSIS — E7800 Pure hypercholesterolemia, unspecified: Secondary | ICD-10-CM

## 2020-06-23 DIAGNOSIS — R202 Paresthesia of skin: Secondary | ICD-10-CM

## 2020-06-23 DIAGNOSIS — Z86010 Personal history of colonic polyps: Secondary | ICD-10-CM

## 2020-06-23 DIAGNOSIS — R079 Chest pain, unspecified: Secondary | ICD-10-CM

## 2020-06-23 DIAGNOSIS — R208 Other disturbances of skin sensation: Secondary | ICD-10-CM

## 2020-06-23 DIAGNOSIS — Z125 Encounter for screening for malignant neoplasm of prostate: Secondary | ICD-10-CM

## 2020-06-23 NOTE — Progress Notes (Signed)
Patient ID: Dean Callahan, male   DOB: 07-27-1971, 49 y.o.   MRN: [id number]   Subjective:    Patient ID: Dean Callahan, male    DOB: 1970/10/06, 49 y.o.   MRN: [id number]  HPI This visit occurred during the SARS-CoV-2 public health emergency.  Safety protocols were in place, including screening questions prior to the visit, additional usage of staff PPE, and extensive cleaning of exam room while observing appropriate contact time as indicated for disinfecting solutions.  Patient here for a scheduled follow up.  Needs work form completed.  Also has noticed some chest pain.  Does not occur when he is doing something active.  Has noticed after playing golf, after working out or after push ups.  When occurs - is intermittent all day.  No sob. No cough or congestion.  Eating.  No nausea or vomiting.  No association with eating.  No abdominal pain.  Bowels moving.  Does report some numbness - hand.  No motor weakness.  Discussed CTS.  Discussed wrist splint.  Discussed diet and exercise.  No headache or dizziness.   Past Medical History:  Diagnosis Date  . History of chicken pox   . Hypercholesterolemia    Past Surgical History:  Procedure Laterality Date  . ACHILLES TENDON SURGERY Right   . ANKLE SURGERY Left   . ANTERIOR CRUCIATE LIGAMENT REPAIR Right    Family History  Problem Relation Age of Onset  . Diabetes Mother   . Hyperlipidemia Father   . Heart disease Paternal Grandmother        heart attack   Social History   Socioeconomic History  . Marital status: Married    Spouse name: Not on file  . Number of children: 3  . Years of education: Not on file  . Highest education level: Not on file  Occupational History  . Occupation: Freight forwarder  Tobacco Use  . Smoking status: Never Smoker  . Smokeless tobacco: Never Used  Substance and Sexual Activity  . Alcohol use: No    Alcohol/week: 0.0 standard drinks  . Drug use: No  . Sexual activity: Not on file  Other Topics Concern  .  Not on file  Social History Narrative  . Not on file   Social Determinants of Health   Financial Resource Strain:   . Difficulty of Paying Living Expenses: Not on file  Food Insecurity:   . Worried About Charity fundraiser in the Last Year: Not on file  . Ran Out of Food in the Last Year: Not on file  Transportation Needs:   . Lack of Transportation (Medical): Not on file  . Lack of Transportation (Non-Medical): Not on file  Physical Activity:   . Days of Exercise per Week: Not on file  . Minutes of Exercise per Session: Not on file  Stress:   . Feeling of Stress : Not on file  Social Connections:   . Frequency of Communication with Friends and Family: Not on file  . Frequency of Social Gatherings with Friends and Family: Not on file  . Attends Religious Services: Not on file  . Active Member of Clubs or Organizations: Not on file  . Attends Archivist Meetings: Not on file  . Marital Status: Not on file    No outpatient encounter medications on file as of 06/23/2020.   No facility-administered encounter medications on file as of 06/23/2020.   Review of Systems  Constitutional: Negative for appetite change and unexpected weight  change.  HENT: Negative for congestion and sinus pressure.   Respiratory: Negative for cough, chest tightness and shortness of breath.   Cardiovascular: Negative for chest pain, palpitations and leg swelling.  Gastrointestinal: Negative for abdominal pain, diarrhea, nausea and vomiting.  Genitourinary: Negative for difficulty urinating and dysuria.  Musculoskeletal: Negative for joint swelling and myalgias.  Skin: Negative for color change and rash.  Neurological: Negative for dizziness, light-headedness and headaches.       Reports numbness in hands as outlined.  No motor weakness.    Psychiatric/Behavioral: Negative for agitation and dysphoric mood.       Objective:    Physical Exam Vitals reviewed.  Constitutional:      General:  He is not in acute distress.    Appearance: Normal appearance. He is well-developed.  HENT:     Head: Normocephalic and atraumatic.     Right Ear: External ear normal.     Left Ear: External ear normal.  Eyes:     General: No scleral icterus.       Right eye: No discharge.        Left eye: No discharge.     Conjunctiva/sclera: Conjunctivae normal.  Cardiovascular:     Rate and Rhythm: Normal rate and regular rhythm.     Pulses: Normal pulses.  Pulmonary:     Effort: Pulmonary effort is normal. No respiratory distress.     Breath sounds: Normal breath sounds.  Abdominal:     General: Bowel sounds are normal.     Palpations: Abdomen is soft.     Tenderness: There is no abdominal tenderness.  Musculoskeletal:        General: No swelling or tenderness.     Cervical back: Neck supple. No tenderness.  Lymphadenopathy:     Cervical: No cervical adenopathy.  Skin:    Findings: No erythema or rash.  Neurological:     Mental Status: He is alert.  Psychiatric:        Mood and Affect: Mood normal.        Behavior: Behavior normal.     BP 128/78   Pulse 80   Temp 98.2 F (36.8 C) (Oral)   Resp 16   Ht 6' (1.829 m)   Wt 238 lb (108 kg)   SpO2 97%   BMI 32.28 kg/m  Wt Readings from Last 3 Encounters:  06/23/20 238 lb (108 kg)  09/16/18 236 lb 3.2 oz (107.1 kg)  07/19/18 237 lb (107.5 kg)     Lab Results  Component Value Date   WBC 4.7 06/23/2020   HGB 14.8 06/23/2020   HCT 44.0 06/23/2020   PLT 233 06/23/2020   GLUCOSE 94 06/23/2020   CHOL 227 (A) 05/20/2020   TRIG 60 05/20/2020   HDL 57 05/20/2020   LDLCALC 160 05/20/2020   ALT 27 06/23/2020   AST 21 06/23/2020   NA 140 06/23/2020   K 5.0 06/23/2020   CL 104 06/23/2020   CREATININE 1.24 06/23/2020   BUN 11 06/23/2020   CO2 24 06/23/2020   TSH 1.160 06/23/2020   HGBA1C 5.3 05/20/2020        Assessment & Plan:   Problem List Items Addressed This Visit    Numbness and tingling in right hand     Describes the numbness as outlined.  Negative phalens and tinels.   Possible CTS.  Discussed wrist splint.  Discussed NCS.  Follow.        Relevant Orders   Vitamin B12 (  Completed)   Ambulatory referral to Neurology   Hypercholesterolemia    The 10-year ASCVD risk score Mikey Bussing DC Brooke Bonito., et al., 2013) is: 5.6%*   Values used to calculate the score:     Age: 37 years     Sex: Male     Is Non-Hispanic African American: Yes     Diabetic: No     Tobacco smoker: No     Systolic Blood Pressure: 242 mmHg     Is BP treated: No     HDL Cholesterol: 57 mg/dL*     Total Cholesterol: 227 mg/dL*     * - Cholesterol units were assumed for this score calculation   Low cholesterol diet and exercise.  Follow lipid panel.       Relevant Orders   CBC with Differential/Platelet (Completed)   Hepatic function panel (Completed)   TSH (Completed)   Basic metabolic panel (Completed)   History of colonic polyps    Colonoscopy 02/2015.  Cardiology evaluation prior to f/u colonoscopy.       Chest pain    Describes the chest pain as outlined.  Does not occur with increased activity and exertion.  Occurs after playing golf, push ups, working out, etc.  No pain to palpation.  EKG - SR with no acute ischemic changes.  Given persistent intermittent pain, will have cardiology evaluate with question of need for any further cardiac w/up (for example calcium score, etc).  Pt comfortable with this plan.        Relevant Orders   EKG 12-Lead (Completed)   Ambulatory referral to Cardiology   Burning sensation of lower extremity    He describes the numbness in his hand as well as burning sensation in his lower extremities.  Given persistent, check labs, including cbc and B12.  Also obtain NCS given involvement of upper and lower extremities.        Relevant Orders   Sedimentation rate (Completed)   Ambulatory referral to Neurology    Other Visit Diagnoses    Prostate cancer screening    -  Primary   Relevant  Orders   PSA (Completed)       Einar Pheasant, MD

## 2020-06-23 NOTE — Assessment & Plan Note (Addendum)
The 10-year ASCVD risk score Mikey Bussing DC Brooke Bonito., et al., 2013) is: 5.6%*   Values used to calculate the score:     Age: 49 years     Sex: Male     Is Non-Hispanic African American: Yes     Diabetic: No     Tobacco smoker: No     Systolic Blood Pressure: 505 mmHg     Is BP treated: No     HDL Cholesterol: 57 mg/dL*     Total Cholesterol: 227 mg/dL*     * - Cholesterol units were assumed for this score calculation   Low cholesterol diet and exercise.  Follow lipid panel.

## 2020-06-24 LAB — CBC WITH DIFFERENTIAL/PLATELET
Basophils Absolute: 0.1 10*3/uL (ref 0.0–0.2)
Basos: 2 %
EOS (ABSOLUTE): 0.4 10*3/uL (ref 0.0–0.4)
Eos: 8 %
Hematocrit: 44 % (ref 37.5–51.0)
Hemoglobin: 14.8 g/dL (ref 13.0–17.7)
Immature Grans (Abs): 0 10*3/uL (ref 0.0–0.1)
Immature Granulocytes: 0 %
Lymphocytes Absolute: 2 10*3/uL (ref 0.7–3.1)
Lymphs: 44 %
MCH: 31.8 pg (ref 26.6–33.0)
MCHC: 33.6 g/dL (ref 31.5–35.7)
MCV: 94 fL (ref 79–97)
Monocytes Absolute: 0.5 10*3/uL (ref 0.1–0.9)
Monocytes: 12 %
Neutrophils Absolute: 1.6 10*3/uL (ref 1.4–7.0)
Neutrophils: 34 %
Platelets: 233 10*3/uL (ref 150–450)
RBC: 4.66 x10E6/uL (ref 4.14–5.80)
RDW: 11.7 % (ref 11.6–15.4)
WBC: 4.7 10*3/uL (ref 3.4–10.8)

## 2020-06-24 LAB — BASIC METABOLIC PANEL
BUN/Creatinine Ratio: 9 (ref 9–20)
BUN: 11 mg/dL (ref 6–24)
CO2: 24 mmol/L (ref 20–29)
Calcium: 9 mg/dL (ref 8.7–10.2)
Chloride: 104 mmol/L (ref 96–106)
Creatinine, Ser: 1.24 mg/dL (ref 0.76–1.27)
GFR calc Af Amer: 77 mL/min/{1.73_m2} (ref >59)
GFR calc non Af Amer: 67 mL/min/{1.73_m2} (ref >59)
Glucose: 94 mg/dL (ref 65–99)
Potassium: 5 mmol/L (ref 3.5–5.2)
Sodium: 140 mmol/L (ref 134–144)

## 2020-06-24 LAB — PSA: Prostate Specific Ag, Serum: 1.5 ng/mL (ref 0.0–4.0)

## 2020-06-24 LAB — HEPATIC FUNCTION PANEL
ALT: 27 IU/L (ref 0–44)
AST: 21 IU/L (ref 0–40)
Albumin: 4.1 g/dL (ref 3.8–4.9)
Alkaline Phosphatase: 69 IU/L (ref 44–121)
Bilirubin Total: 0.4 mg/dL (ref 0.0–1.2)
Bilirubin, Direct: <0.1 mg/dL (ref 0.00–0.40)
Total Protein: 6.8 g/dL (ref 6.0–8.5)

## 2020-06-24 LAB — VITAMIN B12: Vitamin B-12: 245 pg/mL (ref 232–1245)

## 2020-06-24 LAB — SEDIMENTATION RATE: Sed Rate: 2 mm/hr (ref 0–30)

## 2020-06-24 LAB — TSH: TSH: 1.16 u[IU]/mL (ref 0.450–4.500)

## 2020-06-25 ENCOUNTER — Telehealth: Payer: Self-pay | Admitting: Internal Medicine

## 2020-06-25 DIAGNOSIS — E875 Hyperkalemia: Secondary | ICD-10-CM

## 2020-06-25 NOTE — Telephone Encounter (Signed)
Order placed for f/u potassium to be drawn at [place]

## 2020-07-04 ENCOUNTER — Encounter: Payer: Self-pay | Admitting: Internal Medicine

## 2020-07-04 NOTE — Assessment & Plan Note (Signed)
Colonoscopy 02/2015.  Cardiology evaluation prior to f/u colonoscopy.

## 2020-07-04 NOTE — Assessment & Plan Note (Signed)
Describes the chest pain as outlined.  Does not occur with increased activity and exertion.  Occurs after playing golf, push ups, working out, etc.  No pain to palpation.  EKG - SR with no acute ischemic changes.  Given persistent intermittent pain, will have cardiology evaluate with question of need for any further cardiac w/up (for example calcium score, etc).  Pt comfortable with this plan.

## 2020-07-04 NOTE — Assessment & Plan Note (Signed)
Describes the numbness as outlined.  Negative phalens and tinels.   Possible CTS.  Discussed wrist splint.  Discussed NCS.  Follow.

## 2020-07-04 NOTE — Assessment & Plan Note (Signed)
He describes the numbness in his hand as well as burning sensation in his lower extremities.  Given persistent, check labs, including cbc and B12.  Also obtain NCS given involvement of upper and lower extremities.

## 2020-07-16 LAB — POTASSIUM: Potassium: 4.9 mmol/L (ref 3.5–5.2)

## 2020-07-22 ENCOUNTER — Ambulatory Visit: Payer: Managed Care, Other (non HMO) | Admitting: Internal Medicine

## 2020-07-27 ENCOUNTER — Encounter: Payer: Self-pay | Admitting: Cardiology

## 2020-07-27 ENCOUNTER — Ambulatory Visit: Payer: Managed Care, Other (non HMO) | Admitting: Cardiology

## 2020-07-27 ENCOUNTER — Other Ambulatory Visit: Payer: Self-pay

## 2020-07-27 VITALS — BP 130/90 | HR 78 | Ht 72.0 in | Wt 239.1 lb

## 2020-07-27 DIAGNOSIS — K219 Gastro-esophageal reflux disease without esophagitis: Secondary | ICD-10-CM

## 2020-07-27 DIAGNOSIS — R079 Chest pain, unspecified: Secondary | ICD-10-CM | POA: Diagnosis not present

## 2020-07-27 MED ORDER — OMEPRAZOLE MAGNESIUM 20 MG PO TBEC: 20.0000 mg | DELAYED_RELEASE_TABLET | Freq: Every day | ORAL | Status: DC

## 2020-07-27 NOTE — Progress Notes (Signed)
Cardiology Office Note:    Date:  07/27/2020   ID:  Dean Callahan, DOB May 19, 1971, MRN [id number]  PCP:  Einar Pheasant, MD  [facility] Cardiologist:  Kate Sable, MD  [facility] Electrophysiologist:  None   Referring MD: Einar Pheasant, MD   Chief Complaint  Patient presents with  . other    Chest pain no complaints today. Meds reviewed verbally with pt.   Dean Callahan is a 49 y.o. male who is being seen today for the evaluation of chest pain at the request of Einar Pheasant, MD.   History of Present Illness:    Dean Callahan is a 49 y.o. male with a hx of GERD who presents due to chest pain.  Patient states having chest pain symptoms for about 2 to 3 years now.  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  The benchpress bar which weighed about 160 pounds slipped off his hand and fell on his chest, he had soreness for couple of months and then resolved.  He has noticed a sharp chest discomfort which occurs sometimes when he out stretches his arm, and sometimes after he has played golf.  He does cycling about 10 minutes daily without any symptoms of chest pain.  Also has stairs at home has no problems going up and down the stairs.  Denies any history of heart disease.  Denies smoking.  Chest discomfort is not reproducible with palpation.  He endorses having reflux, sometimes worse at night when he is laying flat.  Takes over-the-counter Pepcid which typically helps with his symptoms.  Past Medical History:  Diagnosis Date  . History of chicken pox   . Hypercholesterolemia     Past Surgical History:  Procedure Laterality Date  . ACHILLES TENDON SURGERY Right   . ANKLE SURGERY Left   . ANTERIOR CRUCIATE LIGAMENT REPAIR Right     Current Medications: No outpatient medications have been marked as taking for the 07/27/20 encounter (Office Visit) with Kate Sable, MD.     Allergies:   Patient has no known allergies.   Social History    Socioeconomic History  . Marital status: Married    Spouse name: Not on file  . Number of children: 3  . Years of education: Not on file  . Highest education level: Not on file  Occupational History  . Occupation: Freight forwarder  Tobacco Use  . Smoking status: Never Smoker  . Smokeless tobacco: Never Used  Substance and Sexual Activity  . Alcohol use: No    Alcohol/week: 0.0 standard drinks  . Drug use: No  . Sexual activity: Not on file  Other Topics Concern  . Not on file  Social History Narrative  . Not on file   Social Determinants of Health   Financial Resource Strain:   . Difficulty of Paying Living Expenses: Not on file  Food Insecurity:   . Worried About Charity fundraiser in the Last Year: Not on file  . Ran Out of Food in the Last Year: Not on file  Transportation Needs:   . Lack of Transportation (Medical): Not on file  . Lack of Transportation (Non-Medical): Not on file  Physical Activity:   . Days of Exercise per Week: Not on file  . Minutes of Exercise per Session: Not on file  Stress:   . Feeling of Stress : Not on file  Social Connections:   . Frequency of Communication with Friends and Family: Not  on file  . Frequency of Social Gatherings with Friends and Family: Not on file  . Attends Religious Services: Not on file  . Active Member of Clubs or Organizations: Not on file  . Attends Archivist Meetings: Not on file  . Marital Status: Not on file     Family History: The patient's family history includes Diabetes in his mother; Heart disease in his paternal grandmother; Heart failure in his father; Hyperlipidemia in his father.  ROS:   Please see the history of present illness.     All other systems reviewed and are negative.  EKGs/Labs/Other Studies Reviewed:    The following studies were reviewed today:   EKG:  EKG is  ordered today.  The ekg ordered today demonstrates sinus rhythm, normal ECG.  Recent Labs: 06/23/2020: ALT 27; BUN  11; Creatinine, Ser 1.24; Hemoglobin 14.8; Platelets 233; Sodium 140; TSH 1.160 07/15/2020: Potassium 4.9  Recent Lipid Panel    Component Value Date/Time   CHOL 227 (A) 05/20/2020 0000   CHOL 219 (H) 11/21/2019 0834   TRIG 60 05/20/2020 0000   HDL 57 05/20/2020 0000   HDL 51 11/21/2019 0834   CHOLHDL 4.3 11/21/2019 0834   LDLCALC 160 05/20/2020 0000   LDLCALC 155 (H) 11/21/2019 0834     Risk Assessment/Calculations:      Physical Exam:    VS:  BP 130/90 (BP Location: Left Arm, Patient Position: Sitting, Cuff Size: Large)   Pulse 78   Ht 6' (1.829 m)   Wt 239 lb 2 oz (108.5 kg)   SpO2 98%   BMI 32.43 kg/m     Wt Readings from Last 3 Encounters:  07/27/20 239 lb 2 oz (108.5 kg)  06/23/20 238 lb (108 kg)  09/16/18 236 lb 3.2 oz (107.1 kg)     GEN:  Well nourished, well developed in no acute distress HEENT: Normal NECK: No JVD; No carotid bruits LYMPHATICS: No lymphadenopathy CARDIAC: RRR, no murmurs, rubs, gallops RESPIRATORY:  Clear to auscultation without rales, wheezing or rhonchi  ABDOMEN: Soft, non-tender, non-distended MUSCULOSKELETAL:  No edema; No deformity  SKIN: Warm and dry NEUROLOGIC:  Alert and oriented x 3 PSYCHIATRIC:  Normal affect   ASSESSMENT:    1. Chest pain of uncertain etiology   2. Gastroesophageal reflux disease without esophagitis    PLAN:    In order of problems listed above:  1. Patient with history of chest discomfort, reproducible when he extends his arms, and sometimes after golf.  Denies any symptoms with exertion such as cycling or  going upstairs.  He has no cardiac risk factors.  He is low risk for cardiac disease, his symptoms appear noncardiac.  As such testing is not indicated at this point as per [facility] AHA 2021 guidelines.  Patient educated on cardiac presentation of chest pain.  If symptoms were to persist, become worse, or suggest a cardiac etiology, additional testing will be pursued. 2. He has a history of reflux,  over-the-counter Prilosec 1 tab daily recommended.  Follow-up as needed   Medication Adjustments/Labs and Tests Ordered: Current medicines are reviewed at length with the patient today.  Concerns regarding medicines are outlined above.  Orders Placed This Encounter  Procedures  . EKG 12-Lead   Meds ordered this encounter  Medications  . omeprazole (PRILOSEC OTC) 20 MG tablet    Sig: Take 1 tablet (20 mg total) by mouth daily.    Patient Instructions  Medication Instructions:   Your physician has recommended you  make the following change in your medication:   1.  START omeprazole (PRILOSEC Over the Counter) 20 MG tablet: Take 1 tablet by mouth daily.  *If you need a refill on your cardiac medications before your next appointment, please call your pharmacy*   Lab Work: None Ordered. If you have labs (blood work) drawn today and your tests are completely normal, you will receive your results only by: Marland Kitchen MyChart Message (if you have MyChart) OR . A paper copy in the mail If you have any lab test that is abnormal or we need to change your treatment, we will call you to review the results.   Testing/Procedures: None Ordered   Follow-Up: At [facility], you and your health needs are our priority.  As part of our continuing mission to provide you with exceptional heart care, we have created designated Provider Care Teams.  These Care Teams include your primary Cardiologist (physician) and Advanced Practice Providers (APPs -  Physician Assistants and Nurse Practitioners) who all work together to provide you with the care you need, when you need it.  We recommend signing up for the patient portal called "MyChart".  Sign up information is provided on this After Visit Summary.  MyChart is used to connect with patients for Virtual Visits (Telemedicine).  Patients are able to view lab/test results, encounter notes, upcoming appointments, etc.  Non-urgent messages can be sent to your  provider as well.   To learn more about what you can do with MyChart, go to [email or web address].    Your next appointment:   Follow up as needed   The format for your next appointment:   In Person  Provider:   Kate Sable, MD   Other Instructions      Signed, Kate Sable, MD  07/27/2020 10:16 AM    [place]

## 2020-07-27 NOTE — Patient Instructions (Signed)
Medication Instructions:   Your physician has recommended you make the following change in your medication:   1.  START omeprazole (PRILOSEC Over the Counter) 20 MG tablet: Take 1 tablet by mouth daily.  *If you need a refill on your cardiac medications before your next appointment, please call your pharmacy*   Lab Work: None Ordered. If you have labs (blood work) drawn today and your tests are completely normal, you will receive your results only by: Marland Kitchen MyChart Message (if you have MyChart) OR . A paper copy in the mail If you have any lab test that is abnormal or we need to change your treatment, we will call you to review the results.   Testing/Procedures: None Ordered   Follow-Up: At [facility], you and your health needs are our priority.  As part of our continuing mission to provide you with exceptional heart care, we have created designated Provider Care Teams.  These Care Teams include your primary Cardiologist (physician) and Advanced Practice Providers (APPs -  Physician Assistants and Nurse Practitioners) who all work together to provide you with the care you need, when you need it.  We recommend signing up for the patient portal called "MyChart".  Sign up information is provided on this After Visit Summary.  MyChart is used to connect with patients for Virtual Visits (Telemedicine).  Patients are able to view lab/test results, encounter notes, upcoming appointments, etc.  Non-urgent messages can be sent to your provider as well.   To learn more about what you can do with MyChart, go to [email or web address].    Your next appointment:   Follow up as needed   The format for your next appointment:   In Person  Provider:   Kate Sable, MD   Other Instructions

## 2020-08-23 ENCOUNTER — Ambulatory Visit: Payer: Managed Care, Other (non HMO) | Admitting: Internal Medicine

## 2020-09-28 ENCOUNTER — Encounter: Payer: Self-pay | Admitting: Internal Medicine

## 2020-09-28 ENCOUNTER — Ambulatory Visit (INDEPENDENT_AMBULATORY_CARE_PROVIDER_SITE_OTHER): Payer: Managed Care, Other (non HMO) | Admitting: Internal Medicine

## 2020-09-28 ENCOUNTER — Other Ambulatory Visit: Payer: Self-pay

## 2020-09-28 VITALS — BP 118/70 | HR 96 | Temp 98.6°F | Resp 16 | Ht 72.0 in | Wt 245.0 lb

## 2020-09-28 DIAGNOSIS — Z86010 Personal history of colonic polyps: Secondary | ICD-10-CM

## 2020-09-28 DIAGNOSIS — Z0000 Encounter for general adult medical examination without abnormal findings: Secondary | ICD-10-CM

## 2020-09-28 DIAGNOSIS — E7800 Pure hypercholesterolemia, unspecified: Secondary | ICD-10-CM | POA: Diagnosis not present

## 2020-09-28 DIAGNOSIS — K219 Gastro-esophageal reflux disease without esophagitis: Secondary | ICD-10-CM | POA: Diagnosis not present

## 2020-09-28 DIAGNOSIS — Z1211 Encounter for screening for malignant neoplasm of colon: Secondary | ICD-10-CM

## 2020-09-28 NOTE — Patient Instructions (Signed)
Go by [employer] and having fasting labs drawn in 2-3 months.

## 2020-09-28 NOTE — Assessment & Plan Note (Addendum)
Colonoscopy 02/2015.  Physical today 09/28/20. 06/23/20 - psa 1.5.

## 2020-09-28 NOTE — Progress Notes (Unsigned)
Patient ID: Dean Callahan, male   DOB: January 22, 1971, 50 y.o.   MRN: [id number]   Subjective:    Patient ID: Dean Callahan, male    DOB: March 10, 1971, 50 y.o.   MRN: [id number]  HPI This visit occurred during the SARS-CoV-2 public health emergency.  Safety protocols were in place, including screening questions prior to the visit, additional usage of staff PPE, and extensive cleaning of exam room while observing appropriate contact time as indicated for disinfecting solutions.  Patient here for a physical exam.  He reports he is doing well.  Tries to stay active.  Has not been exercising as much.  Plans to start.  Discussed diet and exercise.  Recently evaluated by cardiology for chest pain.  Felt to be a low risk for cardiac disease.  Did not feel further testing warranted. Recommended prilosec.  No chest congestion or sob.  No abdominal pain.  Bowels moving.  Last colonoscopy 2016.  Two polyps removed.  Unclear pathology.  Also being seen by neurology for numbness in his legs (upper legs/thighs).  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  MRI ordered.  Normal EMG testing.  Vitamin D level low.     Past Medical History:  Diagnosis Date  . History of chicken pox   . Hypercholesterolemia    Past Surgical History:  Procedure Laterality Date  . ACHILLES TENDON SURGERY Right   . ANKLE SURGERY Left   . ANTERIOR CRUCIATE LIGAMENT REPAIR Right    Family History  Problem Relation Age of Onset  . Diabetes Mother   . Hyperlipidemia Father   . Heart failure Father   . Heart disease Paternal Grandmother        heart attack   Social History   Socioeconomic History  . Marital status: Married    Spouse name: Not on file  . Number of children: 3  . Years of education: Not on file  . Highest education level: Not on file  Occupational History  . Occupation: Freight forwarder  Tobacco Use  . Smoking status: Never Smoker  . Smokeless tobacco: Never Used  Substance and Sexual Activity  .  Alcohol use: No    Alcohol/week: 0.0 standard drinks  . Drug use: No  . Sexual activity: Not on file  Other Topics Concern  . Not on file  Social History Narrative  . Not on file   Social Determinants of Health   Financial Resource Strain: Not on file  Food Insecurity: Not on file  Transportation Needs: Not on file  Physical Activity: Not on file  Stress: Not on file  Social Connections: Not on file    Outpatient Encounter Medications as of 09/28/2020  Medication Sig  . omeprazole (PRILOSEC OTC) 20 MG tablet Take 1 tablet (20 mg total) by mouth daily.   No facility-administered encounter medications on file as of 09/28/2020.    Review of Systems  Constitutional: Negative for appetite change and unexpected weight change.  HENT: Negative for congestion, sinus pressure and sore throat.   Eyes: Negative for pain and visual disturbance.  Respiratory: Negative for cough, chest tightness and shortness of breath.   Cardiovascular: Negative for chest pain, palpitations and leg swelling.  Gastrointestinal: Negative for abdominal pain, constipation and diarrhea.  Genitourinary: Negative for difficulty urinating and dysuria.  Musculoskeletal: Negative for joint swelling and myalgias.  Skin: Negative for color change and rash.  Neurological: Negative for dizziness, light-headedness and headaches.  Hematological: Negative for adenopathy. Does not bruise/bleed easily.  Psychiatric/Behavioral: Negative for agitation and dysphoric mood.       Objective:    Physical Exam Vitals reviewed.  Constitutional:      General: He is not in acute distress.    Appearance: Normal appearance. He is well-developed and well-nourished.  HENT:     Head: Normocephalic and atraumatic.     Right Ear: External ear normal.     Left Ear: External ear normal.     Mouth/Throat:     Mouth: Oropharynx is clear and moist.  Eyes:     General: No scleral icterus.       Right eye: No discharge.        Left eye:  No discharge.     Conjunctiva/sclera: Conjunctivae normal.  Neck:     Thyroid: No thyromegaly.  Cardiovascular:     Rate and Rhythm: Normal rate and regular rhythm.  Pulmonary:     Effort: No respiratory distress.     Breath sounds: Normal breath sounds. No wheezing.  Abdominal:     General: Bowel sounds are normal.     Palpations: Abdomen is soft.     Tenderness: There is no abdominal tenderness.  Genitourinary:    Comments: Not performed.  Musculoskeletal:        General: No swelling, tenderness or edema.     Cervical back: Neck supple. No tenderness.  Lymphadenopathy:     Cervical: No cervical adenopathy.  Skin:    Findings: No erythema or rash.  Neurological:     Mental Status: He is alert and oriented to person, place, and time.  Psychiatric:        Mood and Affect: Mood and affect and mood normal.        Behavior: Behavior normal.     BP 118/70   Pulse 96   Temp 98.6 F (37 C) (Oral)   Resp 16   Ht 6' (1.829 m)   Wt 245 lb (111.1 kg)   SpO2 98%   BMI 33.23 kg/m  Wt Readings from Last 3 Encounters:  09/28/20 245 lb (111.1 kg)  07/27/20 239 lb 2 oz (108.5 kg)  06/23/20 238 lb (108 kg)     Lab Results  Component Value Date   WBC 4.7 06/23/2020   HGB 14.8 06/23/2020   HCT 44.0 06/23/2020   PLT 233 06/23/2020   GLUCOSE 94 06/23/2020   CHOL 227 (A) 05/20/2020   TRIG 60 05/20/2020   HDL 57 05/20/2020   LDLCALC 160 05/20/2020   ALT 27 06/23/2020   AST 21 06/23/2020   NA 140 06/23/2020   K 4.9 07/15/2020   CL 104 06/23/2020   CREATININE 1.24 06/23/2020   BUN 11 06/23/2020   CO2 24 06/23/2020   TSH 1.160 06/23/2020   HGBA1C 5.3 05/20/2020       Assessment & Plan:   Problem List Items Addressed This Visit    Hypercholesterolemia    The 10-year ASCVD risk score Mikey Bussing DC Jr., et al., 2013) is: 4.8%*   Values used to calculate the score:     Age: 60 years     Sex: Male     Is Non-Hispanic African American: Yes     Diabetic: No     Tobacco  smoker: No     Systolic Blood Pressure: 123456 mmHg     Is BP treated: No     HDL Cholesterol: 57 mg/dL*     Total Cholesterol: 227 mg/dL*     * - Cholesterol units were  assumed for this score calculation  Low cholesterol diet and exercise.  Follow lipid panel.  Gets labs at [facility].        Relevant Orders   Hepatic function panel   Lipid panel   Basic metabolic panel   Health care maintenance    Colonoscopy 02/2015.  Physical today 09/28/20. 06/23/20 - psa 1.5.       History of colonic polyps    Colonoscopy 02/2015 - two ascending colon polyps and mild diverticulosis.  Given polyps, will refer back to GI for f/u colonoscopy.         Relevant Orders   Ambulatory referral to Gastroenterology   GERD (gastroesophageal reflux disease)    Takes prilosec.  Controlled.        Other Visit Diagnoses    Routine general medical examination at a health care facility    -  Primary   Colon cancer screening       Relevant Orders   Ambulatory referral to Gastroenterology       Dale Wyeville, MD

## 2020-10-02 ENCOUNTER — Encounter: Payer: Self-pay | Admitting: Internal Medicine

## 2020-10-02 NOTE — Assessment & Plan Note (Signed)
Colonoscopy 02/2015 - two ascending colon polyps and mild diverticulosis.  Given polyps, will refer back to GI for f/u colonoscopy.

## 2020-10-02 NOTE — Assessment & Plan Note (Signed)
Takes prilosec.  Controlled.  °

## 2020-10-02 NOTE — Assessment & Plan Note (Signed)
The 10-year ASCVD risk score Mikey Bussing DC Brooke Bonito., et al., 2013) is: 4.8%*   Values used to calculate the score:     Age: 50 years     Sex: Male     Is Non-Hispanic African American: Yes     Diabetic: No     Tobacco smoker: No     Systolic Blood Pressure: 295 mmHg     Is BP treated: No     HDL Cholesterol: 57 mg/dL*     Total Cholesterol: 227 mg/dL*     * - Cholesterol units were assumed for this score calculation  Low cholesterol diet and exercise.  Follow lipid panel.  Gets labs at [facility].

## 2020-11-22 ENCOUNTER — Ambulatory Visit: Payer: Managed Care, Other (non HMO)

## 2020-11-22 ENCOUNTER — Other Ambulatory Visit: Payer: Self-pay

## 2020-11-22 VITALS — Ht 72.0 in | Wt 245.0 lb

## 2020-11-22 DIAGNOSIS — Z860100 Personal history of colon polyps, unspecified: Secondary | ICD-10-CM

## 2020-11-22 MED ORDER — NA SULFATE-K SULFATE-MG SULF 17.5-3.13-1.6 GM/177ML PO SOLN: 1.0000 | Freq: Once | ORAL | 0 refills | Status: AC

## 2020-11-22 NOTE — Progress Notes (Signed)
No allergies to soy or egg Pt is not on blood thinners or diet pills Denies issues with sedation/intubation Denies atrial flutter/fib Denies constipation   Emmi instructions given to pt  Pt is aware of Covid safety and care partner requirements.  

## 2020-12-06 ENCOUNTER — Encounter: Payer: Self-pay | Admitting: Internal Medicine

## 2020-12-21 ENCOUNTER — Ambulatory Visit (AMBULATORY_SURGERY_CENTER): Payer: Managed Care, Other (non HMO) | Admitting: Internal Medicine

## 2020-12-21 ENCOUNTER — Other Ambulatory Visit: Payer: Self-pay

## 2020-12-21 ENCOUNTER — Encounter: Payer: Self-pay | Admitting: Internal Medicine

## 2020-12-21 VITALS — BP 151/73 | HR 79 | Temp 97.3°F | Resp 23 | Ht 72.0 in | Wt 245.0 lb

## 2020-12-21 DIAGNOSIS — Z86010 Personal history of colonic polyps: Secondary | ICD-10-CM

## 2020-12-21 DIAGNOSIS — D122 Benign neoplasm of ascending colon: Secondary | ICD-10-CM

## 2020-12-21 DIAGNOSIS — K621 Rectal polyp: Secondary | ICD-10-CM

## 2020-12-21 DIAGNOSIS — D128 Benign neoplasm of rectum: Secondary | ICD-10-CM

## 2020-12-21 MED ORDER — SODIUM CHLORIDE 0.9 % IV SOLN: 500.0000 mL | Freq: Once | INTRAVENOUS | Status: DC

## 2020-12-21 NOTE — Patient Instructions (Signed)
Handout provided on polyps and diverticulosis.   Repeat colonoscopy in 5 years for screening purposes.   YOU HAD AN ENDOSCOPIC PROCEDURE TODAY AT [facility]:   Refer to the procedure report that was given to you for any specific questions about what was found during the examination.  If the procedure report does not answer your questions, please call your gastroenterologist to clarify.  If you requested that your care partner not be given the details of your procedure findings, then the procedure report has been included in a sealed envelope for you to review at your convenience later.  YOU SHOULD EXPECT: Some feelings of bloating in the abdomen. Passage of more gas than usual.  Walking can help get rid of the air that was put into your GI tract during the procedure and reduce the bloating. If you had a lower endoscopy (such as a colonoscopy or flexible sigmoidoscopy) you may notice spotting of blood in your stool or on the toilet paper. If you underwent a bowel prep for your procedure, you may not have a normal bowel movement for a few days.  Please Note:  You might notice some irritation and congestion in your nose or some drainage.  This is from the oxygen used during your procedure.  There is no need for concern and it should clear up in a day or so.  SYMPTOMS TO REPORT IMMEDIATELY:   Following lower endoscopy (colonoscopy or flexible sigmoidoscopy):  Excessive amounts of blood in the stool  Significant tenderness or worsening of abdominal pains  Swelling of the abdomen that is new, acute  Fever of 100F or higher  For urgent or emergent issues, a gastroenterologist can be reached at any hour by calling [phone]. Do not use MyChart messaging for urgent concerns.    DIET:  We do recommend a small meal at first, but then you may proceed to your regular diet.  Drink plenty of fluids but you should avoid alcoholic beverages for 24 hours.  ACTIVITY:  You should plan to  take it easy for the rest of today and you should NOT DRIVE or use heavy machinery until tomorrow (because of the sedation medicines used during the test).    FOLLOW UP: Our staff will call the number listed on your records 48-72 hours following your procedure to check on you and address any questions or concerns that you may have regarding the information given to you following your procedure. If we do not reach you, we will leave a message.  We will attempt to reach you two times.  During this call, we will ask if you have developed any symptoms of COVID 19. If you develop any symptoms (ie: fever, flu-like symptoms, shortness of breath, cough etc.) before then, please call [phone].  If you test positive for Covid 19 in the 2 weeks post procedure, please call and report this information to [place].    If any biopsies were taken you will be contacted by phone or by letter within the next 1-3 weeks.  Please call us at [phone] if you have not heard about the biopsies in 3 weeks.    SIGNATURES/CONFIDENTIALITY: You and/or your care partner have signed paperwork which will be entered into your electronic medical record.  These signatures attest to the fact that that the information above on your After Visit Summary has been reviewed and is understood.  Full responsibility of the confidentiality of this discharge information lies with you and/or your care-partner.

## 2020-12-21 NOTE — Progress Notes (Signed)
Report to PACU, RN, vss, BBS= Clear.  

## 2020-12-21 NOTE — Progress Notes (Signed)
Called to room to assist during endoscopic procedure.  Patient ID and intended procedure confirmed with present staff. Received instructions for my participation in the procedure from the performing physician.  

## 2020-12-21 NOTE — Progress Notes (Signed)
VS by MO   I have reviewed the patient's medical history in detail and updated the computerized patient record.  

## 2020-12-21 NOTE — Op Note (Signed)
[facility] Patient Name: Dean Callahan Procedure Date: 12/21/2020 2:20 PM MRN: [id number] Endoscopist: Docia Chuck. Henrene Pastor , MD Age: 50 Referring MD:  Date of Birth: 12/21/1970 Gender: Male Account #: [id number] Procedure:                Colonoscopy with cold snare polypectomy x 2 Indications:              High risk colon cancer surveillance: Personal                            history of non-advanced adenomas (June 2016) Medicines:                Monitored Anesthesia Care Procedure:                Pre-Anesthesia Assessment:                           - Prior to the procedure, a History and Physical                            was performed, and patient medications and                            allergies were reviewed. The patient's tolerance of                            previous anesthesia was also reviewed. The risks                            and benefits of the procedure and the sedation                            options and risks were discussed with the patient.                            All questions were answered, and informed consent                            was obtained. Prior Anticoagulants: The patient has                            taken no previous anticoagulant or antiplatelet                            agents. ASA Grade Assessment: I - A normal, healthy                            patient. After reviewing the risks and benefits,                            the patient was deemed in satisfactory condition to                            undergo the procedure.  After obtaining informed consent, the colonoscope                            was passed under direct vision. Throughout the                            procedure, the patient's blood pressure, pulse, and                            oxygen saturations were monitored continuously. The                            Colonoscope was introduced through the anus and                             advanced to the the cecum, identified by                            appendiceal orifice and ileocecal valve. The                            ileocecal valve, appendiceal orifice, and rectum                            were photographed. The quality of the bowel                            preparation was excellent. The colonoscopy was                            performed without difficulty. The patient tolerated                            the procedure well. The bowel preparation used was                            SUPREP via split dose instruction. Scope In: 2:32:42 PM Scope Out: 2:47:45 PM Scope Withdrawal Time: 0 hours 11 minutes 57 seconds  Total Procedure Duration: 0 hours 15 minutes 3 seconds  Findings:                 Two polyps were found in the rectum and ascending                            colon. The polyps were 2 to 3 mm in size. These                            polyps were removed with a cold snare. Resection                            and retrieval were complete.                           A few diverticula were found in the  sigmoid colon.                           The exam was otherwise without abnormality on                            direct and retroflexion views. Complications:            No immediate complications. Estimated blood loss:                            None. Estimated Blood Loss:     Estimated blood loss: none. Estimated blood loss:                            none. Impression:               - Two 2 to 3 mm polyps in the rectum and in the                            ascending colon, removed with a cold snare.                            Resected and retrieved.                           - Diverticulosis in the sigmoid colon.                           - The examination was otherwise normal on direct                            and retroflexion views. Recommendation:           - Repeat colonoscopy in 5 years for surveillance                            (history of  multiple adenomas).                           - Patient has a contact number available for                            emergencies. The signs and symptoms of potential                            delayed complications were discussed with the                            patient. Return to normal activities tomorrow.                            Written discharge instructions were provided to the                            patient.                           -  Resume previous diet.                           - Continue present medications.                           - Await pathology results. Docia Chuck. Henrene Pastor, MD 12/21/2020 2:52:44 PM This report has been signed electronically.

## 2020-12-23 ENCOUNTER — Telehealth: Payer: Self-pay | Admitting: *Deleted

## 2020-12-23 NOTE — Telephone Encounter (Signed)
1. Have you developed a fever since your procedure? no  2.   Have you had an respiratory symptoms (SOB or cough) since your procedure? no  3.   Have you tested positive for COVID 19 since your procedure no  4.   Have you had any family members/close contacts diagnosed with the COVID 19 since your procedure?  no   If yes to any of these questions please route to Joylene John, RN and Joella Prince, RN Follow up Call-  Call back number 12/21/2020  Post procedure Call Back phone  # [phone]  Permission to leave phone message Yes  Some recent data might be hidden     Patient questions:  Do you have a fever, pain , or abdominal swelling? No. Pain Score  0 *  Have you tolerated food without any problems? Yes.    Have you been able to return to your normal activities? Yes.    Do you have any questions about your discharge instructions: Diet   No. Medications  No. Follow up visit  No.  Do you have questions or concerns about your Care? No.  Actions: * If pain score is 4 or above: No action needed, pain <4.

## 2020-12-28 ENCOUNTER — Encounter: Payer: Self-pay | Admitting: Internal Medicine

## 2021-09-29 ENCOUNTER — Ambulatory Visit (INDEPENDENT_AMBULATORY_CARE_PROVIDER_SITE_OTHER): Payer: Managed Care, Other (non HMO) | Admitting: Internal Medicine

## 2021-09-29 ENCOUNTER — Other Ambulatory Visit: Payer: Self-pay

## 2021-09-29 VITALS — BP 126/70 | HR 90 | Temp 97.9°F | Resp 16 | Ht 72.0 in | Wt 237.4 lb

## 2021-09-29 DIAGNOSIS — Z86010 Personal history of colonic polyps: Secondary | ICD-10-CM

## 2021-09-29 DIAGNOSIS — Z125 Encounter for screening for malignant neoplasm of prostate: Secondary | ICD-10-CM

## 2021-09-29 DIAGNOSIS — K219 Gastro-esophageal reflux disease without esophagitis: Secondary | ICD-10-CM

## 2021-09-29 DIAGNOSIS — Z114 Encounter for screening for human immunodeficiency virus [HIV]: Secondary | ICD-10-CM

## 2021-09-29 DIAGNOSIS — Z1159 Encounter for screening for other viral diseases: Secondary | ICD-10-CM

## 2021-09-29 DIAGNOSIS — Z0000 Encounter for general adult medical examination without abnormal findings: Secondary | ICD-10-CM

## 2021-09-29 DIAGNOSIS — E7800 Pure hypercholesterolemia, unspecified: Secondary | ICD-10-CM

## 2021-09-29 DIAGNOSIS — M545 Low back pain, unspecified: Secondary | ICD-10-CM

## 2021-09-29 NOTE — Assessment & Plan Note (Addendum)
Physical today 09/29/21.  Check psa with next labs.  Colonoscopy:  Colonoscopy 12/21/20 - two (2-3 mm polyps in the rectum and ascending colon) and diverticulosis.  Repeat colonoscopy in 5 years.

## 2021-09-29 NOTE — Progress Notes (Signed)
Patient ID: Dean Callahan, male   DOB: [birth year], 51 y.o.   MRN: [id number]   Subjective:    Patient ID: Dean Callahan, male    DOB: 12/17/1970, 51 y.o.   MRN: [id number]  This visit occurred during the SARS-CoV-2 public health emergency.  Safety protocols were in place, including screening questions prior to the visit, additional usage of staff PPE, and extensive cleaning of exam room while observing appropriate contact time as indicated for disinfecting solutions.   Patient here for his physical exam.   Chief Complaint  Patient presents with   Annual Exam   .   HPI Patient here for his physical exam.  He reports he is doing well.  Feels good.  Occasional low back pain.  Recently noticed after playing golf  - increased low back discomfort and spasms.  Alternated ice and heat.  Better now.  No radicular symptoms.  Tries to stay active.  No chest pain or sob reported.  No increased cough or congestion.  No abdominal pain or bowel change.  No urine change.     Past Medical History:  Diagnosis Date   GERD (gastroesophageal reflux disease)    History of chicken pox    Hypercholesterolemia    Past Surgical History:  Procedure Laterality Date   ACHILLES TENDON SURGERY Right    ANKLE SURGERY Left    ANTERIOR CRUCIATE LIGAMENT REPAIR Right    COLONOSCOPY  2016   POLYPECTOMY     Family History  Problem Relation Age of Onset   Diabetes Mother    Hyperlipidemia Father    Heart failure Father    Heart disease Paternal Grandmother        heart attack   Colon cancer Neg Hx    Colon polyps Neg Hx    Esophageal cancer Neg Hx    Rectal cancer Neg Hx    Stomach cancer Neg Hx    Social History   Socioeconomic History   Marital status: Married    Spouse name: Not on file   Number of children: 3   Years of education: Not on file   Highest education level: Not on file  Occupational History   Occupation: Freight forwarder  Tobacco Use   Smoking status: Never   Smokeless tobacco: Never   Vaping Use   Vaping Use: Never used  Substance and Sexual Activity   Alcohol use: No    Alcohol/week: 0.0 standard drinks   Drug use: No   Sexual activity: Not on file  Other Topics Concern   Not on file  Social History Narrative   Not on file   Social Determinants of Health   Financial Resource Strain: Not on file  Food Insecurity: Not on file  Transportation Needs: Not on file  Physical Activity: Not on file  Stress: Not on file  Social Connections: Not on file     Review of Systems  Constitutional:  Negative for appetite change and unexpected weight change.  HENT:  Negative for congestion and sinus pressure.   Respiratory:  Negative for cough, chest tightness and shortness of breath.   Cardiovascular:  Negative for chest pain, palpitations and leg swelling.  Gastrointestinal:  Negative for abdominal pain, diarrhea, nausea and vomiting.  Genitourinary:  Negative for difficulty urinating and dysuria.  Musculoskeletal:  Negative for joint swelling and myalgias.       Previous back pain.   Skin:  Negative for color change and rash.  Neurological:  Negative for dizziness, light-headedness and  headaches.  Psychiatric/Behavioral:  Negative for agitation.       Objective:     BP 126/70    Pulse 90    Temp 97.9 F (36.6 C)    Resp 16    Ht 6' (1.829 m)    Wt 237 lb 6.4 oz (107.7 kg)    SpO2 99%    BMI 32.20 kg/m  Wt Readings from Last 3 Encounters:  09/29/21 237 lb 6.4 oz (107.7 kg)  12/21/20 245 lb (111.1 kg)  11/22/20 245 lb (111.1 kg)    Physical Exam Constitutional:      General: He is not in acute distress.    Appearance: Normal appearance. He is well-developed.  HENT:     Head: Normocephalic and atraumatic.     Right Ear: External ear normal.     Left Ear: External ear normal.  Eyes:     General: No scleral icterus.       Right eye: No discharge.        Left eye: No discharge.     Conjunctiva/sclera: Conjunctivae normal.  Neck:     Thyroid: No  thyromegaly.  Cardiovascular:     Rate and Rhythm: Normal rate and regular rhythm.  Pulmonary:     Effort: No respiratory distress.     Breath sounds: Normal breath sounds. No wheezing.  Abdominal:     General: Bowel sounds are normal.     Palpations: Abdomen is soft.     Tenderness: There is no abdominal tenderness.  Musculoskeletal:        General: No swelling or tenderness.     Cervical back: Neck supple. No tenderness.  Lymphadenopathy:     Cervical: No cervical adenopathy.  Skin:    Findings: No erythema or rash.  Neurological:     Mental Status: He is alert and oriented to person, place, and time.  Psychiatric:        Mood and Affect: Mood normal.        Behavior: Behavior normal.     Outpatient Encounter Medications as of 09/29/2021  Medication Sig   Cyanocobalamin (B-12 PO) Take by mouth.   omeprazole (PRILOSEC OTC) 20 MG tablet Take 1 tablet (20 mg total) by mouth daily.   No facility-administered encounter medications on file as of 09/29/2021.     Lab Results  Component Value Date   WBC 3.8 09/29/2021   HGB 15.2 09/29/2021   HCT 44.6 09/29/2021   PLT 199 09/29/2021   GLUCOSE 117 (H) 09/29/2021   CHOL 230 (H) 09/29/2021   TRIG 79 09/29/2021   HDL 54 09/29/2021   LDLCALC 162 (H) 09/29/2021   ALT 37 09/29/2021   AST 30 09/29/2021   NA 143 09/29/2021   K 4.7 09/29/2021   CL 106 09/29/2021   CREATININE 1.32 (H) 09/29/2021   BUN 9 09/29/2021   CO2 25 09/29/2021   TSH 1.360 09/29/2021   HGBA1C 5.3 05/20/2020       Assessment & Plan:   Problem List Items Addressed This Visit     GERD (gastroesophageal reflux disease)    Takes prilosec.  Controlled.       Health care maintenance    Physical today 09/29/21.  Check psa with next labs.  Colonoscopy:  Colonoscopy 12/21/20 - two (2-3 mm polyps in the rectum and ascending colon) and diverticulosis.  Repeat colonoscopy in 5 years.       History of colonic polyps    Colonoscopy 12/21/20 - two (2-3 mm  polyps in  the rectum and ascending colon) and diverticulosis.  Repeat colonoscopy in 5 years.       Hypercholesterolemia    The 10-year ASCVD risk score (Arnett DK, et al., 2019) is: 5.8%   Values used to calculate the score:     Age: 70 years     Sex: Male     Is Non-Hispanic African American: Yes     Diabetic: No     Tobacco smoker: No     Systolic Blood Pressure: 916 mmHg     Is BP treated: No     HDL Cholesterol: 54 mg/dL     Total Cholesterol: 230 mg/dL  Low cholesterol diet and exercise.  Follow lipid panel.       Relevant Orders   CBC with Differential/Platelet (Completed)   Basic metabolic panel (Completed)   Hepatic function panel (Completed)   Lipid panel (Completed)   TSH (Completed)   Low back pain    Recent flare.  Resolved now.  Follow.        Other Visit Diagnoses     Routine general medical examination at a health care facility    -  Primary   Prostate cancer screening       Relevant Orders   PSA (Completed)   Encounter for hepatitis C screening test for low risk patient       Relevant Orders   Hepatitis C antibody (Completed)   Screening for HIV without presence of risk factors       Relevant Orders   HIV Antibody (routine testing w rflx) (Completed)        Einar Pheasant, MD

## 2021-09-30 LAB — HEPATIC FUNCTION PANEL
ALT: 37 IU/L (ref 0–44)
AST: 30 IU/L (ref 0–40)
Albumin: 4.4 g/dL (ref 3.8–4.9)
Alkaline Phosphatase: 67 IU/L (ref 44–121)
Bilirubin Total: 0.4 mg/dL (ref 0.0–1.2)
Bilirubin, Direct: 0.11 mg/dL (ref 0.00–0.40)
Total Protein: 6.8 g/dL (ref 6.0–8.5)

## 2021-09-30 LAB — CBC WITH DIFFERENTIAL/PLATELET
Basophils Absolute: 0.1 10*3/uL (ref 0.0–0.2)
Basos: 1 %
EOS (ABSOLUTE): 0.1 10*3/uL (ref 0.0–0.4)
Eos: 4 %
Hematocrit: 44.6 % (ref 37.5–51.0)
Hemoglobin: 15.2 g/dL (ref 13.0–17.7)
Immature Grans (Abs): 0 10*3/uL (ref 0.0–0.1)
Immature Granulocytes: 0 %
Lymphocytes Absolute: 1.8 10*3/uL (ref 0.7–3.1)
Lymphs: 47 %
MCH: 31.6 pg (ref 26.6–33.0)
MCHC: 34.1 g/dL (ref 31.5–35.7)
MCV: 93 fL (ref 79–97)
Monocytes Absolute: 0.4 10*3/uL (ref 0.1–0.9)
Monocytes: 10 %
Neutrophils Absolute: 1.4 10*3/uL (ref 1.4–7.0)
Neutrophils: 38 %
Platelets: 199 10*3/uL (ref 150–450)
RBC: 4.81 x10E6/uL (ref 4.14–5.80)
RDW: 11.5 % — ABNORMAL LOW (ref 11.6–15.4)
WBC: 3.8 10*3/uL (ref 3.4–10.8)

## 2021-09-30 LAB — BASIC METABOLIC PANEL
BUN/Creatinine Ratio: 7 — ABNORMAL LOW (ref 9–20)
BUN: 9 mg/dL (ref 6–24)
CO2: 25 mmol/L (ref 20–29)
Calcium: 9.2 mg/dL (ref 8.7–10.2)
Chloride: 106 mmol/L (ref 96–106)
Creatinine, Ser: 1.32 mg/dL — ABNORMAL HIGH (ref 0.76–1.27)
Glucose: 117 mg/dL — ABNORMAL HIGH (ref 70–99)
Potassium: 4.7 mmol/L (ref 3.5–5.2)
Sodium: 143 mmol/L (ref 134–144)
eGFR: 65 mL/min/{1.73_m2} (ref >59)

## 2021-09-30 LAB — LIPID PANEL
Chol/HDL Ratio: 4.3 ratio (ref 0.0–5.0)
Cholesterol, Total: 230 mg/dL — ABNORMAL HIGH (ref 100–199)
HDL: 54 mg/dL (ref >39)
LDL Chol Calc (NIH): 162 mg/dL — ABNORMAL HIGH (ref 0–99)
Triglycerides: 79 mg/dL (ref 0–149)
VLDL Cholesterol Cal: 14 mg/dL (ref 5–40)

## 2021-09-30 LAB — TSH: TSH: 1.36 u[IU]/mL (ref 0.450–4.500)

## 2021-09-30 LAB — HEPATITIS C ANTIBODY: Hep C Virus Ab: <0.1 s/co ratio (ref 0.0–0.9)

## 2021-09-30 LAB — HIV ANTIBODY (ROUTINE TESTING W REFLEX): HIV Screen 4th Generation wRfx: NONREACTIVE

## 2021-09-30 LAB — PSA: Prostate Specific Ag, Serum: 1.6 ng/mL (ref 0.0–4.0)

## 2021-10-03 ENCOUNTER — Encounter: Payer: Self-pay | Admitting: Internal Medicine

## 2021-10-03 DIAGNOSIS — M545 Low back pain, unspecified: Secondary | ICD-10-CM | POA: Insufficient documentation

## 2021-10-03 NOTE — Assessment & Plan Note (Signed)
The 10-year ASCVD risk score (Arnett DK, et al., 2019) is: 5.8%   Values used to calculate the score:     Age: 51 years     Sex: Male     Is Non-Hispanic African American: Yes     Diabetic: No     Tobacco smoker: No     Systolic Blood Pressure: 867 mmHg     Is BP treated: No     HDL Cholesterol: 54 mg/dL     Total Cholesterol: 230 mg/dL  Low cholesterol diet and exercise.  Follow lipid panel.

## 2021-10-03 NOTE — Assessment & Plan Note (Signed)
Colonoscopy 12/21/20 - two (2-3 mm polyps in the rectum and ascending colon) and diverticulosis.  Repeat colonoscopy in 5 years.  

## 2021-10-03 NOTE — Assessment & Plan Note (Signed)
Recent flare.  Resolved now.  Follow.

## 2021-10-03 NOTE — Assessment & Plan Note (Signed)
Takes prilosec.  Controlled.

## 2021-10-04 ENCOUNTER — Telehealth: Payer: Self-pay

## 2021-10-04 NOTE — Telephone Encounter (Signed)
LMTCB regarding labs 

## 2021-10-04 NOTE — Telephone Encounter (Signed)
Patient returned office phone call for lab results. 

## 2021-10-06 NOTE — Telephone Encounter (Signed)
Pt aware of results,

## 2021-10-06 NOTE — Addendum Note (Signed)
Addended by: Lars Masson on: 10/06/2021 08:45 AM   Modules accepted: Orders

## 2022-05-10 ENCOUNTER — Telehealth: Payer: Self-pay | Admitting: Internal Medicine

## 2022-05-10 NOTE — Telephone Encounter (Addendum)
Pt would like to know what orthopedic the provider refer her patients to. Pt would like to be called

## 2022-05-11 NOTE — Telephone Encounter (Signed)
Reviewed message. He is asking for a recommendation.  Need to know what specific symptoms he is having and then can better direct him as to which orthopedist.  (Different orthopedist have different areas they specialize in).  Also, see if he wants me to go ahead and place an order for the referral.

## 2022-05-12 NOTE — Telephone Encounter (Signed)
Just FYI patient actually called [facility] & had made himself an appointment. He is to see Dr. Danae Orleans there. He said that his pain is in right wrist & thumb going down the wrist itself. I asked that he let us know if some some reason he was dissatisfied or wanted second opinion so that we could refer elsewhere.

## 2022-10-02 ENCOUNTER — Ambulatory Visit (INDEPENDENT_AMBULATORY_CARE_PROVIDER_SITE_OTHER): Payer: Managed Care, Other (non HMO) | Admitting: Internal Medicine

## 2022-10-02 ENCOUNTER — Encounter: Payer: Self-pay | Admitting: Internal Medicine

## 2022-10-02 VITALS — BP 126/80 | HR 70 | Temp 98.2°F | Resp 14 | Ht 70.0 in | Wt 243.0 lb

## 2022-10-02 DIAGNOSIS — Z0000 Encounter for general adult medical examination without abnormal findings: Secondary | ICD-10-CM

## 2022-10-02 DIAGNOSIS — Z125 Encounter for screening for malignant neoplasm of prostate: Secondary | ICD-10-CM | POA: Diagnosis not present

## 2022-10-02 DIAGNOSIS — E7800 Pure hypercholesterolemia, unspecified: Secondary | ICD-10-CM

## 2022-10-02 DIAGNOSIS — K219 Gastro-esophageal reflux disease without esophagitis: Secondary | ICD-10-CM

## 2022-10-02 DIAGNOSIS — Z86010 Personal history of colonic polyps: Secondary | ICD-10-CM

## 2022-10-02 NOTE — Progress Notes (Signed)
Subjective:    Patient ID: Dean Callahan, male    DOB: Feb 03, 1971, 52 y.o.   MRN: [id number]  Patient here for  Chief Complaint  Patient presents with   Medical Management of Chronic Issues    HPI Here for a physical exam.  Reports he is doing well.  Some increased stress at work but he is handling this.  Stays active.  Still exercises.  No chest pain tightness or shortness of breath with increased activity or exertion.  No nausea or vomiting.  No bowel changes reported. GERD - controlled -if watches what he eats.  Does not need the Prilosec on a regular basis.  Actually rarely uses.   Past Medical History:  Diagnosis Date   GERD (gastroesophageal reflux disease)    History of chicken pox    Hypercholesterolemia    Past Surgical History:  Procedure Laterality Date   ACHILLES TENDON SURGERY Right    ANKLE SURGERY Left    ANTERIOR CRUCIATE LIGAMENT REPAIR Right    COLONOSCOPY  2016   POLYPECTOMY     Family History  Problem Relation Age of Onset   Diabetes Mother    Hyperlipidemia Father    Heart failure Father    Heart disease Paternal Grandmother        heart attack   Colon cancer Neg Hx    Colon polyps Neg Hx    Esophageal cancer Neg Hx    Rectal cancer Neg Hx    Stomach cancer Neg Hx    Social History   Socioeconomic History   Marital status: Married    Spouse name: Not on file   Number of children: 3   Years of education: Not on file   Highest education level: Not on file  Occupational History   Occupation: Production designer, theatre/television/film  Tobacco Use   Smoking status: Never   Smokeless tobacco: Never  Vaping Use   Vaping Use: Never used  Substance and Sexual Activity   Alcohol use: No    Alcohol/week: 0.0 standard drinks of alcohol   Drug use: No   Sexual activity: Not on file  Other Topics Concern   Not on file  Social History Narrative   Not on file   Social Determinants of Health   Financial Resource Strain: Not on file  Food Insecurity: Not on file   Transportation Needs: Not on file  Physical Activity: Not on file  Stress: Not on file  Social Connections: Not on file     Review of Systems  Constitutional:  Negative for appetite change and unexpected weight change.  HENT:  Negative for congestion, sinus pressure and sore throat.   Eyes:  Negative for pain and visual disturbance.  Respiratory:  Negative for cough, chest tightness and shortness of breath.   Cardiovascular:  Negative for chest pain, palpitations and leg swelling.  Gastrointestinal:  Negative for abdominal pain, diarrhea, nausea and vomiting.  Genitourinary:  Negative for difficulty urinating and dysuria.  Musculoskeletal:  Negative for joint swelling and myalgias.  Skin:  Negative for color change and rash.  Neurological:  Negative for dizziness and headaches.  Hematological:  Negative for adenopathy. Does not bruise/bleed easily.  Psychiatric/Behavioral:  Negative for agitation and dysphoric mood.        Objective:     BP 126/80 (BP Location: Left Arm, Patient Position: Sitting, Cuff Size: Large)   Pulse 70   Temp 98.2 F (36.8 C) (Temporal)   Resp 14   Ht 5\' 10"  (1.778 m)  Wt 243 lb (110.2 kg)   SpO2 97%   BMI 34.87 kg/m  Wt Readings from Last 3 Encounters:  10/02/22 243 lb (110.2 kg)  09/29/21 237 lb 6.4 oz (107.7 kg)  12/21/20 245 lb (111.1 kg)    Physical Exam Constitutional:      General: He is not in acute distress.    Appearance: Normal appearance. He is well-developed.  HENT:     Head: Normocephalic and atraumatic.     Right Ear: External ear normal.     Left Ear: External ear normal.  Eyes:     General: No scleral icterus.       Right eye: No discharge.        Left eye: No discharge.     Conjunctiva/sclera: Conjunctivae normal.  Neck:     Thyroid: No thyromegaly.  Cardiovascular:     Rate and Rhythm: Normal rate and regular rhythm.  Pulmonary:     Effort: No respiratory distress.     Breath sounds: Normal breath sounds. No  wheezing.  Abdominal:     General: Bowel sounds are normal.     Palpations: Abdomen is soft.     Tenderness: There is no abdominal tenderness.  Musculoskeletal:        General: No swelling or tenderness.     Cervical back: Neck supple. No tenderness.  Lymphadenopathy:     Cervical: No cervical adenopathy.  Skin:    Findings: No erythema or rash.  Neurological:     Mental Status: He is alert and oriented to person, place, and time.  Psychiatric:        Mood and Affect: Mood normal.        Behavior: Behavior normal.      Outpatient Encounter Medications as of 10/02/2022  Medication Sig   Cyanocobalamin (B-12 PO) Take by mouth.   omeprazole (PRILOSEC OTC) 20 MG tablet Take 1 tablet (20 mg total) by mouth daily.   No facility-administered encounter medications on file as of 10/02/2022.     Lab Results  Component Value Date   WBC 3.8 09/29/2021   HGB 15.2 09/29/2021   HCT 44.6 09/29/2021   PLT 199 09/29/2021   GLUCOSE 117 (H) 09/29/2021   CHOL 230 (H) 09/29/2021   TRIG 79 09/29/2021   HDL 54 09/29/2021   LDLCALC 162 (H) 09/29/2021   ALT 37 09/29/2021   AST 30 09/29/2021   NA 143 09/29/2021   K 4.7 09/29/2021   CL 106 09/29/2021   CREATININE 1.32 (H) 09/29/2021   BUN 9 09/29/2021   CO2 25 09/29/2021   TSH 1.360 09/29/2021   HGBA1C 5.3 05/20/2020       Assessment & Plan:  Routine general medical examination at a health care facility  Hypercholesterolemia Assessment & Plan: The 10-year ASCVD risk score (Arnett DK, et al., 2019) is: 6.1%   Values used to calculate the score:     Age: 31 years     Sex: Male     Is Non-Hispanic African American: Yes     Diabetic: No     Tobacco smoker: No     Systolic Blood Pressure: 126 mmHg     Is BP treated: No     HDL Cholesterol: 54 mg/dL     Total Cholesterol: 230 mg/dL  Low cholesterol diet and exercise.  Follow lipid panel.   Orders: -     CBC with Differential/Platelet -     Comprehensive metabolic panel -  TSH -     Lipid panel  Prostate cancer screening -     PSA  Gastroesophageal reflux disease, unspecified whether esophagitis present Assessment & Plan:  GERD - controlled -if watches what he eats.  Does not need the Prilosec on a regular basis.  Actually rarely uses.   Health care maintenance Assessment & Plan: Physical today 10/02/22.  Check psa with next labs.  Colonoscopy:  Colonoscopy 12/21/20 - two (2-3 mm polyps in the rectum and ascending colon) and diverticulosis.  Repeat colonoscopy in 5 years.    History of colonic polyps Assessment & Plan: Colonoscopy 12/21/20 - two (2-3 mm polyps in the rectum and ascending colon) and diverticulosis.  Repeat colonoscopy in 5 years.       Dale [place], MD

## 2022-10-03 ENCOUNTER — Encounter: Payer: Self-pay | Admitting: Internal Medicine

## 2022-10-03 NOTE — Assessment & Plan Note (Signed)
GERD - controlled -if watches what he eats.  Does not need the Prilosec on a regular basis.  Actually rarely uses.

## 2022-10-03 NOTE — Assessment & Plan Note (Signed)
The 10-year ASCVD risk score (Arnett DK, et al., 2019) is: 6.1%   Values used to calculate the score:     Age: 52 years     Sex: Male     Is Non-Hispanic African American: Yes     Diabetic: No     Tobacco smoker: No     Systolic Blood Pressure: 500 mmHg     Is BP treated: No     HDL Cholesterol: 54 mg/dL     Total Cholesterol: 230 mg/dL  Low cholesterol diet and exercise.  Follow lipid panel.

## 2022-10-03 NOTE — Assessment & Plan Note (Signed)
Colonoscopy 12/21/20 - two (2-3 mm polyps in the rectum and ascending colon) and diverticulosis.  Repeat colonoscopy in 5 years.

## 2022-10-03 NOTE — Assessment & Plan Note (Signed)
Physical today 10/02/22.  Check psa with next labs.  Colonoscopy:  Colonoscopy 12/21/20 - two (2-3 mm polyps in the rectum and ascending colon) and diverticulosis.  Repeat colonoscopy in 5 years.

## 2022-10-07 ENCOUNTER — Telehealth: Payer: Self-pay | Admitting: Internal Medicine

## 2022-10-07 DIAGNOSIS — R739 Hyperglycemia, unspecified: Secondary | ICD-10-CM

## 2022-10-07 LAB — CBC WITH DIFFERENTIAL/PLATELET
Basophils Absolute: 0.1 10*3/uL (ref 0.0–0.2)
Basos: 1 %
EOS (ABSOLUTE): 0.2 10*3/uL (ref 0.0–0.4)
Eos: 5 %
Hematocrit: 44.2 % (ref 37.5–51.0)
Hemoglobin: 14.8 g/dL (ref 13.0–17.7)
Immature Grans (Abs): 0 10*3/uL (ref 0.0–0.1)
Immature Granulocytes: 0 %
Lymphocytes Absolute: 1.7 10*3/uL (ref 0.7–3.1)
Lymphs: 44 %
MCH: 32.5 pg (ref 26.6–33.0)
MCHC: 33.5 g/dL (ref 31.5–35.7)
MCV: 97 fL (ref 79–97)
Monocytes Absolute: 0.4 10*3/uL (ref 0.1–0.9)
Monocytes: 11 %
Neutrophils Absolute: 1.5 10*3/uL (ref 1.4–7.0)
Neutrophils: 39 %
Platelets: 201 10*3/uL (ref 150–450)
RBC: 4.56 x10E6/uL (ref 4.14–5.80)
RDW: 11.7 % (ref 11.6–15.4)
WBC: 3.9 10*3/uL (ref 3.4–10.8)

## 2022-10-07 LAB — COMPREHENSIVE METABOLIC PANEL
ALT: 32 IU/L (ref 0–44)
AST: 24 IU/L (ref 0–40)
Albumin/Globulin Ratio: 1.5 (ref 1.2–2.2)
Albumin: 4.3 g/dL (ref 3.8–4.9)
Alkaline Phosphatase: 71 IU/L (ref 44–121)
BUN/Creatinine Ratio: 10 (ref 9–20)
BUN: 12 mg/dL (ref 6–24)
Bilirubin Total: 0.4 mg/dL (ref 0.0–1.2)
CO2: 24 mmol/L (ref 20–29)
Calcium: 9 mg/dL (ref 8.7–10.2)
Chloride: 104 mmol/L (ref 96–106)
Creatinine, Ser: 1.25 mg/dL (ref 0.76–1.27)
Globulin, Total: 2.9 g/dL (ref 1.5–4.5)
Glucose: 118 mg/dL — ABNORMAL HIGH (ref 70–99)
Potassium: 4.5 mmol/L (ref 3.5–5.2)
Sodium: 141 mmol/L (ref 134–144)
Total Protein: 7.2 g/dL (ref 6.0–8.5)
eGFR: 69 mL/min/{1.73_m2} (ref >59)

## 2022-10-07 LAB — PSA: Prostate Specific Ag, Serum: 1.4 ng/mL (ref 0.0–4.0)

## 2022-10-07 LAB — LIPID PANEL
Chol/HDL Ratio: 4.8 ratio (ref 0.0–5.0)
Cholesterol, Total: 225 mg/dL — ABNORMAL HIGH (ref 100–199)
HDL: 47 mg/dL (ref >39)
LDL Chol Calc (NIH): 167 mg/dL — ABNORMAL HIGH (ref 0–99)
Triglycerides: 65 mg/dL (ref 0–149)
VLDL Cholesterol Cal: 11 mg/dL (ref 5–40)

## 2022-10-07 LAB — TSH: TSH: 1.35 u[IU]/mL (ref 0.450–4.500)

## 2022-10-07 NOTE — Telephone Encounter (Signed)
Orders placed for f/u fasting labs at [facility]

## 2023-10-05 ENCOUNTER — Encounter: Payer: Self-pay | Admitting: Internal Medicine

## 2023-10-05 ENCOUNTER — Ambulatory Visit (INDEPENDENT_AMBULATORY_CARE_PROVIDER_SITE_OTHER): Payer: Managed Care, Other (non HMO) | Admitting: Internal Medicine

## 2023-10-05 VITALS — BP 118/70 | HR 90 | Temp 98.0°F | Resp 16 | Ht 72.0 in | Wt 246.0 lb

## 2023-10-05 DIAGNOSIS — R739 Hyperglycemia, unspecified: Secondary | ICD-10-CM | POA: Insufficient documentation

## 2023-10-05 DIAGNOSIS — Z0000 Encounter for general adult medical examination without abnormal findings: Secondary | ICD-10-CM

## 2023-10-05 DIAGNOSIS — Z125 Encounter for screening for malignant neoplasm of prostate: Secondary | ICD-10-CM

## 2023-10-05 DIAGNOSIS — M25521 Pain in right elbow: Secondary | ICD-10-CM

## 2023-10-05 DIAGNOSIS — E7800 Pure hypercholesterolemia, unspecified: Secondary | ICD-10-CM | POA: Diagnosis not present

## 2023-10-05 DIAGNOSIS — L989 Disorder of the skin and subcutaneous tissue, unspecified: Secondary | ICD-10-CM | POA: Insufficient documentation

## 2023-10-05 DIAGNOSIS — K219 Gastro-esophageal reflux disease without esophagitis: Secondary | ICD-10-CM

## 2023-10-05 NOTE — Assessment & Plan Note (Signed)
 Physical today 10/05/23.  Check psa with next labs.  Colonoscopy 12/21/20 - two (2-3 mm polyps in the rectum and ascending colon) and diverticulosis.  Repeat colonoscopy in 5 years.

## 2023-10-05 NOTE — Assessment & Plan Note (Signed)
 Check fasting sugar and A1c.

## 2023-10-05 NOTE — Assessment & Plan Note (Signed)
 Persistent issues as outlined.  Unable to fully extend arm. Saw ortho. On meloxicam. Discussed possible side effects of the medication. Planning for MRI.

## 2023-10-05 NOTE — Assessment & Plan Note (Signed)
 The 10-year ASCVD risk score (Arnett DK, et al., 2019) is: 5.8%   Values used to calculate the score:     Age: 54 years     Sex: Male     Is Non-Hispanic African American: Yes     Diabetic: No     Tobacco smoker: No     Systolic Blood Pressure: 118 mmHg     Is BP treated: No     HDL Cholesterol: 47 mg/dL     Total Cholesterol: 225 mg/dL  Low cholesterol diet and exercise.  Follow lipid panel.

## 2023-10-05 NOTE — Progress Notes (Signed)
 Subjective:    Patient ID: Dean Callahan, male    DOB: 1971/07/02, 53 y.o.   MRN: [id number]  Patient here for  Chief Complaint  Patient presents with   Annual Exam    HPI Here for a physical exam.  GERD - controlled -if watches what he eats.  He has been having some right elbow/arm pain.  States that approximately one month ago, he was carrying food and mail and dropped the mail. Quickly tried to catch it and felt he aggravated something in his arm/elbow. Has increased discomfort and limited rom since.  States he is unable to fully extend his right arm. Has seen ortho.  Placed on meloxicam. Discussed possible side effects of the medication. Planning for MRI. No chest pain or sob reported. No abdominal pain or bowel change reported.    Past Medical History:  Diagnosis Date   GERD (gastroesophageal reflux disease)    History of chicken pox    Hypercholesterolemia    Past Surgical History:  Procedure Laterality Date   ACHILLES TENDON SURGERY Right    ANKLE SURGERY Left    ANTERIOR CRUCIATE LIGAMENT REPAIR Right    COLONOSCOPY  2016   POLYPECTOMY     Family History  Problem Relation Age of Onset   Diabetes Mother    Hyperlipidemia Father    Heart failure Father    Heart disease Paternal Grandmother        heart attack   Colon cancer Neg Hx    Colon polyps Neg Hx    Esophageal cancer Neg Hx    Rectal cancer Neg Hx    Stomach cancer Neg Hx    Social History   Socioeconomic History   Marital status: Married    Spouse name: Not on file   Number of children: 3   Years of education: Not on file   Highest education level: Not on file  Occupational History   Occupation: production designer, theatre/television/film  Tobacco Use   Smoking status: Never   Smokeless tobacco: Never  Vaping Use   Vaping status: Never Used  Substance and Sexual Activity   Alcohol use: No    Alcohol/week: 0.0 standard drinks of alcohol   Drug use: No   Sexual activity: Not on file  Other Topics Concern   Not on file   Social History Narrative   Not on file   Social Drivers of Health   Financial Resource Strain: Not on file  Food Insecurity: Not on file  Transportation Needs: Not on file  Physical Activity: Not on file  Stress: Not on file  Social Connections: Not on file     Review of Systems  Constitutional:  Negative for appetite change and unexpected weight change.  HENT:  Negative for congestion, sinus pressure and sore throat.   Eyes:  Negative for pain and visual disturbance.  Respiratory:  Negative for cough, chest tightness and shortness of breath.   Cardiovascular:  Negative for chest pain, palpitations and leg swelling.  Gastrointestinal:  Negative for abdominal pain, diarrhea, nausea and vomiting.  Genitourinary:  Negative for difficulty urinating and dysuria.  Musculoskeletal:  Negative for joint swelling and myalgias.       Right elbow pain as outlined.   Skin:  Negative for color change and rash.  Neurological:  Negative for dizziness and headaches.  Hematological:  Negative for adenopathy. Does not bruise/bleed easily.  Psychiatric/Behavioral:  Negative for agitation and dysphoric mood.        Objective:  BP 118/70   Pulse 90   Temp 98 F (36.7 C)   Resp 16   Ht 6' (1.829 m)   Wt 246 lb (111.6 kg)   SpO2 98%   BMI 33.36 kg/m  Wt Readings from Last 3 Encounters:  10/05/23 246 lb (111.6 kg)  10/02/22 243 lb (110.2 kg)  09/29/21 237 lb 6.4 oz (107.7 kg)    Physical Exam Constitutional:      General: He is not in acute distress.    Appearance: He is well-developed.  HENT:     Head: Normocephalic and atraumatic.     Nose: Nose normal.     Mouth/Throat:     Pharynx: No oropharyngeal exudate.  Eyes:     General:        Right eye: No discharge.        Left eye: No discharge.     Conjunctiva/sclera: Conjunctivae normal.  Neck:     Thyroid: No thyromegaly.  Cardiovascular:     Rate and Rhythm: Normal rate and regular rhythm.  Pulmonary:     Effort: No  respiratory distress.     Breath sounds: Normal breath sounds. No wheezing.  Abdominal:     General: Bowel sounds are normal.     Palpations: Abdomen is soft.     Tenderness: There is no abdominal tenderness.  Genitourinary:    Penis: Normal.      Rectum: Normal.  Musculoskeletal:        General: No tenderness.     Cervical back: Neck supple.     Comments: No pain with palpation right arm/right elbow, but does have increased discomfort with attempts at full extension and unable to fully extend arm.   Lymphadenopathy:     Cervical: No cervical adenopathy.  Skin:    General: Skin is warm and dry.     Findings: No rash.  Neurological:     Mental Status: He is alert and oriented to person, place, and time.  Psychiatric:        Behavior: Behavior normal.      Outpatient Encounter Medications as of 10/05/2023  Medication Sig   omeprazole  (PRILOSEC  OTC) 20 MG tablet Take 1 tablet (20 mg total) by mouth daily.   [DISCONTINUED] Cyanocobalamin (B-12 PO) Take by mouth.   No facility-administered encounter medications on file as of 10/05/2023.     Lab Results  Component Value Date   WBC 3.9 10/06/2022   HGB 14.8 10/06/2022   HCT 44.2 10/06/2022   PLT 201 10/06/2022   GLUCOSE 118 (H) 10/06/2022   CHOL 225 (H) 10/06/2022   TRIG 65 10/06/2022   HDL 47 10/06/2022   LDLCALC 167 (H) 10/06/2022   ALT 32 10/06/2022   AST 24 10/06/2022   NA 141 10/06/2022   K 4.5 10/06/2022   CL 104 10/06/2022   CREATININE 1.25 10/06/2022   BUN 12 10/06/2022   CO2 24 10/06/2022   TSH 1.350 10/06/2022   HGBA1C 5.3 05/20/2020       Assessment & Plan:  Routine general medical examination at a health care facility  Health care maintenance Assessment & Plan: Physical today 10/05/23.  Check psa with next labs.  Colonoscopy 12/21/20 - two (2-3 mm polyps in the rectum and ascending colon) and diverticulosis.  Repeat colonoscopy in 5 years.    Hypercholesterolemia Assessment & Plan: The 10-year  ASCVD risk score (Arnett DK, et al., 2019) is: 5.8%   Values used to calculate the score:  Age: 89 years     Sex: Male     Is Non-Hispanic African American: Yes     Diabetic: No     Tobacco smoker: No     Systolic Blood Pressure: 118 mmHg     Is BP treated: No     HDL Cholesterol: 47 mg/dL     Total Cholesterol: 225 mg/dL  Low cholesterol diet and exercise.  Follow lipid panel.   Orders: -     CBC with Differential/Platelet -     Basic metabolic panel -     Hepatic function panel -     TSH -     Lipid panel  Hyperglycemia Assessment & Plan: Check fasting sugar and A1c.   Orders: -     Basic metabolic panel -     Hemoglobin A1c  Prostate cancer screening -     PSA  Facial lesion Assessment & Plan: Persistent right facial lesion.  Present for months. Does not appear to be infected.  Refer to dermaology.    Gastroesophageal reflux disease, unspecified whether esophagitis present Assessment & Plan:  GERD - controlled -if watches what he eats.  Does not need the Prilosec  on a regular basis.  Actually rarely uses.   Right elbow pain Assessment & Plan: Persistent issues as outlined.  Unable to fully extend arm. Saw ortho. On meloxicam. Discussed possible side effects of the medication. Planning for MRI.        Allena Hamilton, MD

## 2023-10-05 NOTE — Assessment & Plan Note (Signed)
 Persistent right facial lesion.  Present for months. Does not appear to be infected.  Refer to dermaology.

## 2023-10-05 NOTE — Assessment & Plan Note (Signed)
GERD - controlled -if watches what he eats.  Does not need the Prilosec on a regular basis.  Actually rarely uses.

## 2023-10-06 LAB — CBC WITH DIFFERENTIAL/PLATELET
Basophils Absolute: 0.1 10*3/uL (ref 0.0–0.2)
Basos: 2 %
EOS (ABSOLUTE): 0.2 10*3/uL (ref 0.0–0.4)
Eos: 4 %
Hematocrit: 43.9 % (ref 37.5–51.0)
Hemoglobin: 14.7 g/dL (ref 13.0–17.7)
Immature Grans (Abs): 0 10*3/uL (ref 0.0–0.1)
Immature Granulocytes: 0 %
Lymphocytes Absolute: 1.9 10*3/uL (ref 0.7–3.1)
Lymphs: 46 %
MCH: 32.5 pg (ref 26.6–33.0)
MCHC: 33.5 g/dL (ref 31.5–35.7)
MCV: 97 fL (ref 79–97)
Monocytes Absolute: 0.5 10*3/uL (ref 0.1–0.9)
Monocytes: 12 %
Neutrophils Absolute: 1.5 10*3/uL (ref 1.4–7.0)
Neutrophils: 36 %
Platelets: 215 10*3/uL (ref 150–450)
RBC: 4.53 x10E6/uL (ref 4.14–5.80)
RDW: 11.1 % — ABNORMAL LOW (ref 11.6–15.4)
WBC: 4.1 10*3/uL (ref 3.4–10.8)

## 2023-10-06 LAB — BASIC METABOLIC PANEL
BUN/Creatinine Ratio: 10 (ref 9–20)
BUN: 13 mg/dL (ref 6–24)
CO2: 24 mmol/L (ref 20–29)
Calcium: 9 mg/dL (ref 8.7–10.2)
Chloride: 104 mmol/L (ref 96–106)
Creatinine, Ser: 1.33 mg/dL — ABNORMAL HIGH (ref 0.76–1.27)
Glucose: 111 mg/dL — ABNORMAL HIGH (ref 70–99)
Potassium: 4.5 mmol/L (ref 3.5–5.2)
Sodium: 141 mmol/L (ref 134–144)
eGFR: 64 mL/min/{1.73_m2} (ref >59)

## 2023-10-06 LAB — HEPATIC FUNCTION PANEL
ALT: 34 [IU]/L (ref 0–44)
AST: 23 [IU]/L (ref 0–40)
Albumin: 4.1 g/dL (ref 3.8–4.9)
Alkaline Phosphatase: 65 [IU]/L (ref 44–121)
Bilirubin Total: 0.4 mg/dL (ref 0.0–1.2)
Bilirubin, Direct: 0.12 mg/dL (ref 0.00–0.40)
Total Protein: 6.8 g/dL (ref 6.0–8.5)

## 2023-10-06 LAB — TSH: TSH: 1.07 u[IU]/mL (ref 0.450–4.500)

## 2023-10-06 LAB — LIPID PANEL
Chol/HDL Ratio: 4.7 {ratio} (ref 0.0–5.0)
Cholesterol, Total: 223 mg/dL — ABNORMAL HIGH (ref 100–199)
HDL: 47 mg/dL (ref >39)
LDL Chol Calc (NIH): 161 mg/dL — ABNORMAL HIGH (ref 0–99)
Triglycerides: 85 mg/dL (ref 0–149)
VLDL Cholesterol Cal: 15 mg/dL (ref 5–40)

## 2023-10-06 LAB — HEMOGLOBIN A1C
Est. average glucose Bld gHb Est-mCnc: 108 mg/dL
Hgb A1c MFr Bld: 5.4 % (ref 4.8–5.6)

## 2023-10-06 LAB — PSA: Prostate Specific Ag, Serum: 1.6 ng/mL (ref 0.0–4.0)

## 2024-01-21 ENCOUNTER — Encounter: Payer: Self-pay | Admitting: Internal Medicine

## 2024-01-21 DIAGNOSIS — M19021 Primary osteoarthritis, right elbow: Secondary | ICD-10-CM | POA: Insufficient documentation

## 2024-10-07 ENCOUNTER — Ambulatory Visit (INDEPENDENT_AMBULATORY_CARE_PROVIDER_SITE_OTHER): Payer: Managed Care, Other (non HMO) | Admitting: Internal Medicine

## 2024-10-07 ENCOUNTER — Encounter: Payer: Self-pay | Admitting: Internal Medicine

## 2024-10-07 VITALS — BP 122/78 | HR 86 | Temp 99.2°F | Ht 72.0 in | Wt 242.8 lb

## 2024-10-07 DIAGNOSIS — M25521 Pain in right elbow: Secondary | ICD-10-CM | POA: Diagnosis not present

## 2024-10-07 DIAGNOSIS — Z0000 Encounter for general adult medical examination without abnormal findings: Secondary | ICD-10-CM

## 2024-10-07 DIAGNOSIS — R739 Hyperglycemia, unspecified: Secondary | ICD-10-CM

## 2024-10-07 DIAGNOSIS — Z125 Encounter for screening for malignant neoplasm of prostate: Secondary | ICD-10-CM

## 2024-10-07 DIAGNOSIS — Z860100 Personal history of colon polyps, unspecified: Secondary | ICD-10-CM

## 2024-10-07 DIAGNOSIS — E7800 Pure hypercholesterolemia, unspecified: Secondary | ICD-10-CM

## 2024-10-07 NOTE — Assessment & Plan Note (Signed)
 Physical today 10/07/24.  Check psa today.  Colonoscopy 12/21/20 - two (2-3 mm polyps in the rectum and ascending colon) and diverticulosis.  Repeat colonoscopy in 5 years.

## 2024-10-07 NOTE — Progress Notes (Signed)
 "  Subjective:    Patient ID: Dean Callahan, male    DOB: 10-Jul-1971, 54 y.o.   MRN: [id number]  Patient here for  Chief Complaint  Patient presents with   Annual Exam    HPI Here for a physical exam. Was having problems with right elbow last visit. Was seeing ortho. GERD controlled if watches what he eats. No chest pain or sob reported. No abdominal pain or bowel change reported. Still with some right elbow discomfort. Saw ortho. Wanted to hold on surgery at this time. Desires no further intervention.    Past Medical History:  Diagnosis Date   GERD (gastroesophageal reflux disease)    History of chicken pox    Hypercholesterolemia    Past Surgical History:  Procedure Laterality Date   ACHILLES TENDON SURGERY Right    ANKLE SURGERY Left    ANTERIOR CRUCIATE LIGAMENT REPAIR Right    COLONOSCOPY  2016   POLYPECTOMY     Family History  Problem Relation Age of Onset   Diabetes Mother    Hyperlipidemia Father    Heart failure Father    Heart disease Paternal Grandmother        heart attack   Colon cancer Neg Hx    Colon polyps Neg Hx    Esophageal cancer Neg Hx    Rectal cancer Neg Hx    Stomach cancer Neg Hx    Social History   Socioeconomic History   Marital status: Married    Spouse name: Not on file   Number of children: 3   Years of education: Not on file   Highest education level: Bachelor's degree (e.g., BA, AB, BS)  Occupational History   Occupation: production designer, theatre/television/film  Tobacco Use   Smoking status: Never   Smokeless tobacco: Never  Vaping Use   Vaping status: Never Used  Substance and Sexual Activity   Alcohol use: Yes    Alcohol/week: 1.0 standard drink of alcohol    Types: 1 Standard drinks or equivalent per week   Drug use: Never   Sexual activity: Yes    Birth control/protection: None  Other Topics Concern   Not on file  Social History Narrative   Not on file   Social Drivers of Health   Tobacco Use: Low Risk (10/12/2024)   Patient History     Smoking Tobacco Use: Never    Smokeless Tobacco Use: Never    Passive Exposure: Not on file  Financial Resource Strain: Low Risk (10/06/2024)   Overall Financial Resource Strain (CARDIA)    Difficulty of Paying Living Expenses: Not hard at all  Food Insecurity: No Food Insecurity (10/06/2024)   Epic    Worried About Programme Researcher, Broadcasting/film/video in the Last Year: Never true    Ran Out of Food in the Last Year: Never true  Transportation Needs: No Transportation Needs (10/06/2024)   Epic    Lack of Transportation (Medical): No    Lack of Transportation (Non-Medical): No  Physical Activity: Insufficiently Active (10/06/2024)   Exercise Vital Sign    Days of Exercise per Week: 5 days    Minutes of Exercise per Session: 10 min  Stress: No Stress Concern Present (10/06/2024)   [employer] of Occupational Health - Occupational Stress Questionnaire    Feeling of Stress: Not at all  Social Connections: Socially Integrated (10/06/2024)   Social Connection and Isolation Panel    Frequency of Communication with Friends and Family: Twice a week    Frequency of Social  Gatherings with Friends and Family: Once a week    Attends Religious Services: More than 4 times per year    Active Member of Clubs or Organizations: Yes    Attends Banker Meetings: More than 4 times per year    Marital Status: Married  Depression (PHQ2-9): Low Risk (10/07/2024)   Depression (PHQ2-9)    PHQ-2 Score: 0  Alcohol Screen: Low Risk (10/06/2024)   Alcohol Screen    Last Alcohol Screening Score (AUDIT): 2  Housing: Low Risk (10/06/2024)   Epic    Unable to Pay for Housing in the Last Year: No    Number of Times Moved in the Last Year: 0    Homeless in the Last Year: No  Utilities: Not on file  Health Literacy: Not on file     Review of Systems  Constitutional:  Negative for appetite change and unexpected weight change.  HENT:  Negative for congestion, sinus pressure and sore throat.   Eyes:  Negative for  pain and visual disturbance.  Respiratory:  Negative for cough, chest tightness and shortness of breath.   Cardiovascular:  Negative for chest pain, palpitations and leg swelling.  Gastrointestinal:  Negative for abdominal pain, diarrhea, nausea and vomiting.  Genitourinary:  Negative for difficulty urinating and dysuria.  Musculoskeletal:  Negative for joint swelling and myalgias.       Elbow discomfort as outlined. Is some better.   Skin:  Negative for color change and rash.  Neurological:  Negative for dizziness and headaches.  Hematological:  Negative for adenopathy. Does not bruise/bleed easily.  Psychiatric/Behavioral:  Negative for agitation and dysphoric mood.        Objective:     BP 122/78   Pulse 86   Temp 99.2 F (37.3 C) (Oral)   Ht 6' (1.829 m)   Wt 242 lb 12.8 oz (110.1 kg)   SpO2 96%   BMI 32.93 kg/m  Wt Readings from Last 3 Encounters:  10/07/24 242 lb 12.8 oz (110.1 kg)  10/05/23 246 lb (111.6 kg)  10/02/22 243 lb (110.2 kg)    Physical Exam Constitutional:      General: He is not in acute distress.    Appearance: Normal appearance. He is well-developed.  HENT:     Head: Normocephalic and atraumatic.     Right Ear: External ear normal.     Left Ear: External ear normal.     Mouth/Throat:     Pharynx: No oropharyngeal exudate or posterior oropharyngeal erythema.  Eyes:     General: No scleral icterus.       Right eye: No discharge.        Left eye: No discharge.     Conjunctiva/sclera: Conjunctivae normal.  Neck:     Thyroid: No thyromegaly.  Cardiovascular:     Rate and Rhythm: Normal rate and regular rhythm.  Pulmonary:     Effort: No respiratory distress.     Breath sounds: Normal breath sounds. No wheezing.  Abdominal:     General: Bowel sounds are normal.     Palpations: Abdomen is soft.     Tenderness: There is no abdominal tenderness.  Musculoskeletal:        General: No swelling or tenderness.     Cervical back: Neck supple. No  tenderness.  Lymphadenopathy:     Cervical: No cervical adenopathy.  Skin:    Findings: No erythema or rash.  Neurological:     Mental Status: He is alert and oriented to  person, place, and time.  Psychiatric:        Mood and Affect: Mood normal.        Behavior: Behavior normal.         Outpatient Encounter Medications as of 10/07/2024  Medication Sig   [DISCONTINUED] omeprazole  (PRILOSEC  OTC) 20 MG tablet Take 1 tablet (20 mg total) by mouth daily.   No facility-administered encounter medications on file as of 10/07/2024.     Lab Results  Component Value Date   WBC 4.1 10/05/2023   HGB 14.7 10/05/2023   HCT 43.9 10/05/2023   PLT 215 10/05/2023   GLUCOSE 111 (H) 10/05/2023   CHOL 223 (H) 10/05/2023   TRIG 85 10/05/2023   HDL 47 10/05/2023   LDLCALC 161 (H) 10/05/2023   ALT 34 10/05/2023   AST 23 10/05/2023   NA 141 10/05/2023   K 4.5 10/05/2023   CL 104 10/05/2023   CREATININE 1.33 (H) 10/05/2023   BUN 13 10/05/2023   CO2 24 10/05/2023   TSH 1.070 10/05/2023   HGBA1C 5.4 10/05/2023       Assessment & Plan:  Routine general medical examination at a health care facility  Hypercholesterolemia Assessment & Plan: The 10-year ASCVD risk score (Arnett DK, et al., 2019) is: 6.5%   Values used to calculate the score:     Age: 33 years     Clinically relevant sex: Male     Is Non-Hispanic African American: Yes     Diabetic: No     Tobacco smoker: No     Systolic Blood Pressure: 122 mmHg     Is BP treated: No     HDL Cholesterol: 47 mg/dL     Total Cholesterol: 223 mg/dL  Low cholesterol diet and exercise.  Follow lipid panel.   Orders: -     Lipid panel -     TSH -     Hepatic function panel -     Basic metabolic panel with GFR -     CBC with Differential/Platelet  Hyperglycemia Assessment & Plan: Low carb diet and exercise. Follow met b and A1c.   Orders: -     Hemoglobin A1c -     Basic metabolic panel with GFR  Prostate cancer screening -      PSA  Health care maintenance Assessment & Plan: Physical today 10/07/24.  Check psa today.  Colonoscopy 12/21/20 - two (2-3 mm polyps in the rectum and ascending colon) and diverticulosis.  Repeat colonoscopy in 5 years.    Right elbow pain Assessment & Plan: Still with some right elbow discomfort. Saw ortho. Wanted to hold on surgery at this time. Desires no further intervention.   History of colonic polyps Assessment & Plan: Colonoscopy 12/21/20 - two (2-3 mm polyps in the rectum and ascending colon) and diverticulosis.  Repeat colonoscopy in 5 years.       Allena Hamilton, MD "

## 2024-10-12 ENCOUNTER — Encounter: Payer: Self-pay | Admitting: Internal Medicine

## 2024-10-12 NOTE — Assessment & Plan Note (Signed)
 Still with some right elbow discomfort. Saw ortho. Wanted to hold on surgery at this time. Desires no further intervention.

## 2024-10-12 NOTE — Assessment & Plan Note (Signed)
 The 10-year ASCVD risk score (Arnett DK, et al., 2019) is: 6.5%   Values used to calculate the score:     Age: 54 years     Clinically relevant sex: Male     Is Non-Hispanic African American: Yes     Diabetic: No     Tobacco smoker: No     Systolic Blood Pressure: 122 mmHg     Is BP treated: No     HDL Cholesterol: 47 mg/dL     Total Cholesterol: 223 mg/dL  Low cholesterol diet and exercise.  Follow lipid panel.

## 2024-10-12 NOTE — Assessment & Plan Note (Signed)
 Low-carb diet and exercise.  Follow met b and A1c.

## 2024-10-12 NOTE — Assessment & Plan Note (Signed)
Colonoscopy 12/21/20 - two (2-3 mm polyps in the rectum and ascending colon) and diverticulosis.  Repeat colonoscopy in 5 years.  

## 2024-10-26 ENCOUNTER — Telehealth: Payer: Self-pay | Admitting: Internal Medicine

## 2024-10-26 DIAGNOSIS — L989 Disorder of the skin and subcutaneous tissue, unspecified: Secondary | ICD-10-CM

## 2024-10-26 NOTE — Telephone Encounter (Signed)
 Received notification - persistent facial lesion. Requested referral to dermatology. Order placed for referral to dermatology. Also, wife reported persistent increased acid reflux - regurgitation of food. Please schedule a f/u appt to discuss.

## 2024-11-06 ENCOUNTER — Ambulatory Visit

## 2025-10-08 ENCOUNTER — Encounter: Admitting: Internal Medicine
